# Patient Record
Sex: Female | Born: 1950 | Race: Black or African American | Hispanic: No | Marital: Single | Smoking: Former smoker
Health system: Southern US, Community
[De-identification: ages and names within clinical notes are randomized; demographics above are authoritative.]

## PROBLEM LIST (undated history)

## (undated) DIAGNOSIS — Z9889 Other specified postprocedural states: Secondary | ICD-10-CM

## (undated) DIAGNOSIS — M722 Plantar fascial fibromatosis: Secondary | ICD-10-CM

## (undated) DIAGNOSIS — E7800 Pure hypercholesterolemia, unspecified: Secondary | ICD-10-CM

## (undated) DIAGNOSIS — R112 Nausea with vomiting, unspecified: Secondary | ICD-10-CM

## (undated) DIAGNOSIS — I10 Essential (primary) hypertension: Secondary | ICD-10-CM

## (undated) DIAGNOSIS — I7300 Raynaud's syndrome without gangrene: Secondary | ICD-10-CM

## (undated) DIAGNOSIS — M797 Fibromyalgia: Secondary | ICD-10-CM

## (undated) DIAGNOSIS — M961 Postlaminectomy syndrome, not elsewhere classified: Secondary | ICD-10-CM

## (undated) DIAGNOSIS — Z8719 Personal history of other diseases of the digestive system: Secondary | ICD-10-CM

## (undated) DIAGNOSIS — M1990 Unspecified osteoarthritis, unspecified site: Secondary | ICD-10-CM

## (undated) DIAGNOSIS — R519 Headache, unspecified: Secondary | ICD-10-CM

## (undated) DIAGNOSIS — R609 Edema, unspecified: Secondary | ICD-10-CM

## (undated) DIAGNOSIS — G459 Transient cerebral ischemic attack, unspecified: Secondary | ICD-10-CM

## (undated) DIAGNOSIS — I639 Cerebral infarction, unspecified: Secondary | ICD-10-CM

## (undated) DIAGNOSIS — G5603 Carpal tunnel syndrome, bilateral upper limbs: Secondary | ICD-10-CM

## (undated) DIAGNOSIS — D649 Anemia, unspecified: Secondary | ICD-10-CM

## (undated) DIAGNOSIS — E039 Hypothyroidism, unspecified: Secondary | ICD-10-CM

## (undated) DIAGNOSIS — K219 Gastro-esophageal reflux disease without esophagitis: Secondary | ICD-10-CM

## (undated) HISTORY — DX: Gastro-esophageal reflux disease without esophagitis: K21.9

## (undated) HISTORY — DX: Fibromyalgia: M79.7

## (undated) HISTORY — DX: Transient cerebral ischemic attack, unspecified: G45.9

## (undated) HISTORY — DX: Essential (primary) hypertension: I10

## (undated) HISTORY — DX: Hypothyroidism, unspecified: E03.9

## (undated) HISTORY — DX: Pure hypercholesterolemia, unspecified: E78.00

## (undated) HISTORY — PX: BACK SURGERY: SHX140

## (undated) HISTORY — DX: Unspecified osteoarthritis, unspecified site: M19.90

## (undated) HISTORY — PX: ANTERIOR CERVICAL DECOMP/DISCECTOMY FUSION: SHX1161

## (undated) HISTORY — PX: POLYPECTOMY: SHX149

---

## 1972-11-02 HISTORY — PX: TUBAL LIGATION: SHX77

## 1973-11-02 HISTORY — PX: ABDOMINAL HYSTERECTOMY: SHX81

## 1978-11-02 HISTORY — PX: BREAST LUMPECTOMY: SHX2

## 1978-11-02 HISTORY — PX: BREAST EXCISIONAL BIOPSY: SUR124

## 1979-11-03 HISTORY — PX: BREAST CYST EXCISION: SHX579

## 1997-11-02 HISTORY — PX: LUMBAR DISC SURGERY: SHX700

## 2008-11-02 HISTORY — PX: BLADDER SUSPENSION: SHX72

## 2010-11-02 DIAGNOSIS — G459 Transient cerebral ischemic attack, unspecified: Secondary | ICD-10-CM

## 2010-11-02 HISTORY — DX: Transient cerebral ischemic attack, unspecified: G45.9

## 2012-08-04 ENCOUNTER — Emergency Department: Payer: Self-pay | Admitting: Emergency Medicine

## 2012-08-04 LAB — BASIC METABOLIC PANEL
Anion Gap: 7 (ref 7–16)
BUN: 10 mg/dL (ref 7–18)
Calcium, Total: 8.9 mg/dL (ref 8.5–10.1)
Chloride: 103 mmol/L (ref 98–107)
Co2: 31 mmol/L (ref 21–32)
Creatinine: 0.86 mg/dL (ref 0.60–1.30)
EGFR (Non-African Amer.): >60
Potassium: 4 mmol/L (ref 3.5–5.1)
Sodium: 141 mmol/L (ref 136–145)

## 2012-08-04 LAB — CBC
HCT: 35.8 % (ref 35.0–47.0)
MCV: 88 fL (ref 80–100)
RDW: 12.5 % (ref 11.5–14.5)
WBC: 4.3 10*3/uL (ref 3.6–11.0)

## 2012-11-02 HISTORY — PX: EXCISIONAL HEMORRHOIDECTOMY: SHX1541

## 2013-01-04 ENCOUNTER — Ambulatory Visit: Payer: Self-pay | Admitting: Gastroenterology

## 2013-02-27 ENCOUNTER — Ambulatory Visit: Payer: Self-pay | Admitting: Anesthesiology

## 2013-02-27 LAB — CBC
HCT: 35.9 % (ref 35.0–47.0)
MCH: 29.3 pg (ref 26.0–34.0)
MCHC: 34.5 g/dL (ref 32.0–36.0)
MCV: 85 fL (ref 80–100)
Platelet: 265 10*3/uL (ref 150–440)
RDW: 11.8 % (ref 11.5–14.5)
WBC: 3.3 10*3/uL — ABNORMAL LOW (ref 3.6–11.0)

## 2013-02-27 LAB — COMPREHENSIVE METABOLIC PANEL
Albumin: 3.9 g/dL (ref 3.4–5.0)
Alkaline Phosphatase: 147 U/L — ABNORMAL HIGH (ref 50–136)
Anion Gap: 9 (ref 7–16)
BUN: 7 mg/dL (ref 7–18)
Calcium, Total: 8.5 mg/dL (ref 8.5–10.1)
Chloride: 99 mmol/L (ref 98–107)
Co2: 27 mmol/L (ref 21–32)
Creatinine: 0.7 mg/dL (ref 0.60–1.30)
EGFR (Non-African Amer.): >60
Osmolality: 268 (ref 275–301)
Potassium: 3.9 mmol/L (ref 3.5–5.1)
SGOT(AST): 20 U/L (ref 15–37)
Sodium: 135 mmol/L — ABNORMAL LOW (ref 136–145)
Total Protein: 7.4 g/dL (ref 6.4–8.2)

## 2013-02-28 ENCOUNTER — Ambulatory Visit: Payer: Self-pay | Admitting: Surgery

## 2013-04-07 ENCOUNTER — Emergency Department: Payer: Self-pay | Admitting: Emergency Medicine

## 2013-04-07 LAB — CK TOTAL AND CKMB (NOT AT ARMC)
CK, Total: 146 U/L (ref 21–215)
CK-MB: 2.7 ng/mL (ref 0.5–3.6)

## 2013-04-07 LAB — URINALYSIS, COMPLETE
Bacteria: NONE SEEN
Bilirubin,UR: NEGATIVE
Blood: NEGATIVE
Glucose,UR: NEGATIVE mg/dL (ref 0–75)
Ketone: NEGATIVE
Leukocyte Esterase: NEGATIVE
Nitrite: NEGATIVE
RBC,UR: NONE SEEN /HPF (ref 0–5)
WBC UR: <1 /HPF (ref 0–5)

## 2013-04-07 LAB — COMPREHENSIVE METABOLIC PANEL
Albumin: 4.1 g/dL (ref 3.4–5.0)
Calcium, Total: 9.1 mg/dL (ref 8.5–10.1)
Co2: 27 mmol/L (ref 21–32)
Creatinine: 0.63 mg/dL (ref 0.60–1.30)
Glucose: 91 mg/dL (ref 65–99)
Osmolality: 274 (ref 275–301)
SGPT (ALT): 21 U/L (ref 12–78)

## 2013-04-07 LAB — CBC
HCT: 40.2 % (ref 35.0–47.0)
HGB: 13.7 g/dL (ref 12.0–16.0)
MCH: 28.6 pg (ref 26.0–34.0)
MCHC: 34.1 g/dL (ref 32.0–36.0)
MCV: 84 fL (ref 80–100)
Platelet: 317 10*3/uL (ref 150–440)
RDW: 12.4 % (ref 11.5–14.5)

## 2014-03-29 ENCOUNTER — Ambulatory Visit: Payer: Self-pay | Admitting: Family Medicine

## 2014-11-09 ENCOUNTER — Ambulatory Visit: Payer: Self-pay | Admitting: Family Medicine

## 2015-01-09 ENCOUNTER — Emergency Department: Payer: Self-pay | Admitting: Emergency Medicine

## 2015-02-22 NOTE — Op Note (Signed)
PATIENT NAME:  Tommi EmeryBROADNAX, Jamilyn MR#:  [id number] DATE OF BIRTH:  1951/08/27  DATE OF PROCEDURE:  02/28/2013  PREOPERATIVE DIAGNOSIS: Internal and external hemorrhoids.   POSTOPERATIVE DIAGNOSIS: Internal and external hemorrhoids.   PROCEDURE: Internal and external hemorrhoidectomy.   SURGEON: Renda RollsWilton Chondra Boyde, M.D.   ANESTHESIA: General.   INDICATIONS: This 64 year old female has a history of anal pain, hemorrhoid prolapse, and bleeding, and large internal and external hemorrhoids were demonstrated on physical exam and surgery was recommended for definitive treatment.   DESCRIPTION OF PROCEDURE: The patient was placed on the operating table in the supine position under general anesthesia. Legs were elevated into the lithotomy position using ankle straps. The anoderm was examined. There were 4 external hemorrhoids identified which appeared to be in 4 quadrants. The anoderm was infiltrated with 0.5% Sensorcaine with epinephrine. The anal canal was dilated large enough to admit  3 fingers. The bivalve anal retractor was introduced. Multiple large internal hemorrhoids were identified which appeared to be in continuity with the external hemorrhoids. At the 8 o'clock position a high ligation of the internal component was done with a 2-0 chromic suture ligature. A V-shaped incision was made externally, and the external hemorrhoids were dissected away from surrounding subcutaneous tissues with a combination of sharp dissection and electrocautery. The internal anal sphincter was identified. The dissection was carried up to the previously placed suture-ligature and the hemorrhoid was further ligated and amputated. The wound was inspected. Several small bleeding points were cauterized. The wound was then closed with running, locked, tied 2-0 chromic suture, leaving a small opening externally for drainage.   This same procedure was carried out at the 4 o'clock position.   The same procedure was carried out at the  10 o'clock position.   At the 2 o'clock position the internal component was ligated with a 2-0 chromic ligature, and the external hemorrhoid was excised sharply with scissors and wound repaired with 2-0 chromic simple sutures.   No neoplasm was seen during the procedure and hemostasis appeared to be intact. The skin and subcutaneous tissues, and tissues around the sphincter were infiltrated with 20 mL of Exparel.  Dressings were applied with paper tape. The patient tolerated surgery satisfactorily and was prepared for transfer to the recovery room.     ____________________________ Shela CommonsJ. Renda RollsWilton Francesa Eugenio, MD jws:dm D: 02/28/2013 09:26:53 ET T: 02/28/2013 10:22:41 ET JOB#: [id number]  cc: Adella HareJ. Wilton Yatziry Deakins, MD, <Dictator> Adella HareWILTON J Amron Guerrette MD ELECTRONICALLY SIGNED 03/03/2013 20:12

## 2015-03-05 ENCOUNTER — Ambulatory Visit: Payer: Self-pay | Admitting: Pain Medicine

## 2015-03-14 ENCOUNTER — Ambulatory Visit: Payer: Medicare Other | Attending: Pain Medicine | Admitting: Pain Medicine

## 2015-03-14 ENCOUNTER — Encounter (INDEPENDENT_AMBULATORY_CARE_PROVIDER_SITE_OTHER): Payer: Self-pay

## 2015-03-14 ENCOUNTER — Encounter: Payer: Self-pay | Admitting: Pain Medicine

## 2015-03-14 VITALS — BP 127/63 | HR 71 | Temp 98.3°F | Resp 16 | Ht 65.0 in | Wt 170.0 lb

## 2015-03-14 DIAGNOSIS — M79605 Pain in left leg: Secondary | ICD-10-CM | POA: Diagnosis not present

## 2015-03-14 DIAGNOSIS — M79604 Pain in right leg: Secondary | ICD-10-CM | POA: Insufficient documentation

## 2015-03-14 DIAGNOSIS — M5030 Other cervical disc degeneration, unspecified cervical region: Secondary | ICD-10-CM

## 2015-03-14 DIAGNOSIS — M545 Low back pain: Secondary | ICD-10-CM | POA: Insufficient documentation

## 2015-03-14 DIAGNOSIS — G43809 Other migraine, not intractable, without status migrainosus: Secondary | ICD-10-CM

## 2015-03-14 DIAGNOSIS — M47816 Spondylosis without myelopathy or radiculopathy, lumbar region: Secondary | ICD-10-CM

## 2015-03-14 DIAGNOSIS — M542 Cervicalgia: Secondary | ICD-10-CM | POA: Diagnosis not present

## 2015-03-14 DIAGNOSIS — M5136 Other intervertebral disc degeneration, lumbar region: Secondary | ICD-10-CM | POA: Insufficient documentation

## 2015-03-14 DIAGNOSIS — G43909 Migraine, unspecified, not intractable, without status migrainosus: Secondary | ICD-10-CM | POA: Insufficient documentation

## 2015-03-14 DIAGNOSIS — M47812 Spondylosis without myelopathy or radiculopathy, cervical region: Secondary | ICD-10-CM | POA: Insufficient documentation

## 2015-03-14 DIAGNOSIS — M5481 Occipital neuralgia: Secondary | ICD-10-CM

## 2015-03-14 NOTE — Progress Notes (Signed)
Patient discharged ambulatory. teachback 3 done.

## 2015-03-14 NOTE — Progress Notes (Signed)
   Subjective:    Patient ID: Kristin Stuart, female    DOB: May 08, 1951, 64 y.o.   MRN: [id number]  HPI    Review of Systems     Objective:   Physical Exam        Assessment & Plan:

## 2015-03-14 NOTE — Patient Instructions (Addendum)
Continue present medications as per Dr Azzie GlatterFras  F/U PCP for evaliation of  BP and general medical  condition.  F/U surgical evaluation. Nurses please schedule neurosurgical evaluation of pain of the neck and upper extremity pain and weakness and low back pain with lower extremity pain and weakness ASAP   F/U neuological evaluation has been discussed and we will avoid PNCV/ EMG study at this time  May consider radiofrequency rhizolysis or intraspinal procedures pending response to present treatment and F/U evaluation.  [facility] to call [facility] should patient have concerns prior to scheduled return appointment.
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
## 2015-03-27 ENCOUNTER — Other Ambulatory Visit: Payer: Self-pay | Admitting: Pain Medicine

## 2015-05-14 ENCOUNTER — Telehealth: Payer: Self-pay | Admitting: Pain Medicine

## 2015-05-14 NOTE — Telephone Encounter (Signed)
vmail left by patient 05-13-15 5:17 asking about appts that were supposed to be made and did we get information from [facility]? Does Dr. Metta Clinesrisp still require her to have x-rays done?

## 2015-05-17 ENCOUNTER — Telehealth: Payer: Self-pay | Admitting: Pain Medicine

## 2015-05-17 NOTE — Telephone Encounter (Deleted)
PHYS  Wants her to have emg studies in her back / she is still waiting for this appt. States we had to [place]. She has called several times.  She is out of her pain medicines on the 20th

## 2015-05-17 NOTE — Telephone Encounter (Signed)
cancel

## 2015-06-24 ENCOUNTER — Encounter: Payer: Self-pay | Admitting: *Deleted

## 2015-06-24 DIAGNOSIS — E780 Pure hypercholesterolemia: Secondary | ICD-10-CM | POA: Diagnosis not present

## 2015-06-24 DIAGNOSIS — E079 Disorder of thyroid, unspecified: Secondary | ICD-10-CM | POA: Diagnosis not present

## 2015-06-24 DIAGNOSIS — Z90710 Acquired absence of both cervix and uterus: Secondary | ICD-10-CM | POA: Diagnosis not present

## 2015-06-24 DIAGNOSIS — Z9889 Other specified postprocedural states: Secondary | ICD-10-CM | POA: Diagnosis not present

## 2015-06-24 DIAGNOSIS — H2511 Age-related nuclear cataract, right eye: Secondary | ICD-10-CM | POA: Diagnosis not present

## 2015-06-24 DIAGNOSIS — M7989 Other specified soft tissue disorders: Secondary | ICD-10-CM | POA: Diagnosis not present

## 2015-06-24 DIAGNOSIS — Z7982 Long term (current) use of aspirin: Secondary | ICD-10-CM | POA: Diagnosis not present

## 2015-06-24 DIAGNOSIS — I10 Essential (primary) hypertension: Secondary | ICD-10-CM | POA: Diagnosis not present

## 2015-06-24 DIAGNOSIS — M961 Postlaminectomy syndrome, not elsewhere classified: Secondary | ICD-10-CM | POA: Diagnosis not present

## 2015-06-24 DIAGNOSIS — K449 Diaphragmatic hernia without obstruction or gangrene: Secondary | ICD-10-CM | POA: Diagnosis not present

## 2015-06-24 DIAGNOSIS — Z87891 Personal history of nicotine dependence: Secondary | ICD-10-CM | POA: Diagnosis not present

## 2015-06-24 DIAGNOSIS — Z79899 Other long term (current) drug therapy: Secondary | ICD-10-CM | POA: Diagnosis not present

## 2015-06-24 DIAGNOSIS — K219 Gastro-esophageal reflux disease without esophagitis: Secondary | ICD-10-CM | POA: Diagnosis not present

## 2015-06-24 DIAGNOSIS — Z7951 Long term (current) use of inhaled steroids: Secondary | ICD-10-CM | POA: Diagnosis not present

## 2015-06-24 DIAGNOSIS — Z880 Allergy status to penicillin: Secondary | ICD-10-CM | POA: Diagnosis not present

## 2015-06-24 DIAGNOSIS — M797 Fibromyalgia: Secondary | ICD-10-CM | POA: Diagnosis not present

## 2015-06-24 DIAGNOSIS — Z79891 Long term (current) use of opiate analgesic: Secondary | ICD-10-CM | POA: Diagnosis not present

## 2015-06-24 DIAGNOSIS — Z888 Allergy status to other drugs, medicaments and biological substances status: Secondary | ICD-10-CM | POA: Diagnosis not present

## 2015-06-24 DIAGNOSIS — M1990 Unspecified osteoarthritis, unspecified site: Secondary | ICD-10-CM | POA: Diagnosis not present

## 2015-06-24 DIAGNOSIS — Z8673 Personal history of transient ischemic attack (TIA), and cerebral infarction without residual deficits: Secondary | ICD-10-CM | POA: Diagnosis not present

## 2015-06-25 ENCOUNTER — Ambulatory Visit
Admission: RE | Admit: 2015-06-25 | Discharge: 2015-06-25 | Disposition: A | Discharge status: Home / Self Care | Payer: Medicare Other | Source: Ambulatory Visit | Attending: Ophthalmology | Admitting: Ophthalmology

## 2015-06-25 ENCOUNTER — Encounter: Payer: Self-pay | Admitting: *Deleted

## 2015-06-25 ENCOUNTER — Encounter
Admission: RE | Disposition: A | Discharge status: Home / Self Care | Payer: Self-pay | Source: Ambulatory Visit | Attending: Ophthalmology

## 2015-06-25 ENCOUNTER — Ambulatory Visit: Payer: Medicare Other | Admitting: Anesthesiology

## 2015-06-25 DIAGNOSIS — Z8673 Personal history of transient ischemic attack (TIA), and cerebral infarction without residual deficits: Secondary | ICD-10-CM | POA: Insufficient documentation

## 2015-06-25 DIAGNOSIS — Z90710 Acquired absence of both cervix and uterus: Secondary | ICD-10-CM | POA: Insufficient documentation

## 2015-06-25 DIAGNOSIS — Z888 Allergy status to other drugs, medicaments and biological substances status: Secondary | ICD-10-CM | POA: Insufficient documentation

## 2015-06-25 DIAGNOSIS — K219 Gastro-esophageal reflux disease without esophagitis: Secondary | ICD-10-CM | POA: Insufficient documentation

## 2015-06-25 DIAGNOSIS — M797 Fibromyalgia: Secondary | ICD-10-CM | POA: Insufficient documentation

## 2015-06-25 DIAGNOSIS — Z9889 Other specified postprocedural states: Secondary | ICD-10-CM | POA: Insufficient documentation

## 2015-06-25 DIAGNOSIS — M961 Postlaminectomy syndrome, not elsewhere classified: Secondary | ICD-10-CM | POA: Insufficient documentation

## 2015-06-25 DIAGNOSIS — I10 Essential (primary) hypertension: Secondary | ICD-10-CM | POA: Insufficient documentation

## 2015-06-25 DIAGNOSIS — Z7951 Long term (current) use of inhaled steroids: Secondary | ICD-10-CM | POA: Insufficient documentation

## 2015-06-25 DIAGNOSIS — E079 Disorder of thyroid, unspecified: Secondary | ICD-10-CM | POA: Insufficient documentation

## 2015-06-25 DIAGNOSIS — Z79891 Long term (current) use of opiate analgesic: Secondary | ICD-10-CM | POA: Insufficient documentation

## 2015-06-25 DIAGNOSIS — H2511 Age-related nuclear cataract, right eye: Secondary | ICD-10-CM | POA: Insufficient documentation

## 2015-06-25 DIAGNOSIS — Z880 Allergy status to penicillin: Secondary | ICD-10-CM | POA: Insufficient documentation

## 2015-06-25 DIAGNOSIS — M1990 Unspecified osteoarthritis, unspecified site: Secondary | ICD-10-CM | POA: Insufficient documentation

## 2015-06-25 DIAGNOSIS — Z79899 Other long term (current) drug therapy: Secondary | ICD-10-CM | POA: Insufficient documentation

## 2015-06-25 DIAGNOSIS — E780 Pure hypercholesterolemia: Secondary | ICD-10-CM | POA: Insufficient documentation

## 2015-06-25 DIAGNOSIS — K449 Diaphragmatic hernia without obstruction or gangrene: Secondary | ICD-10-CM | POA: Insufficient documentation

## 2015-06-25 DIAGNOSIS — Z87891 Personal history of nicotine dependence: Secondary | ICD-10-CM | POA: Insufficient documentation

## 2015-06-25 DIAGNOSIS — M7989 Other specified soft tissue disorders: Secondary | ICD-10-CM | POA: Insufficient documentation

## 2015-06-25 DIAGNOSIS — Z7982 Long term (current) use of aspirin: Secondary | ICD-10-CM | POA: Insufficient documentation

## 2015-06-25 HISTORY — DX: Edema, unspecified: R60.9

## 2015-06-25 HISTORY — PX: CATARACT EXTRACTION W/PHACO: SHX586

## 2015-06-25 HISTORY — DX: Personal history of other diseases of the digestive system: Z87.19

## 2015-06-25 HISTORY — DX: Other specified postprocedural states: Z98.890

## 2015-06-25 HISTORY — DX: Other specified postprocedural states: R11.2

## 2015-06-25 SURGERY — PHACOEMULSIFICATION, CATARACT, WITH IOL INSERTION
Anesthesia: Monitor Anesthesia Care | Site: Eye | Laterality: Right | Wound class: Clean

## 2015-06-25 MED ORDER — ARMC OPHTHALMIC DILATING GEL
1.0000 "application " | OPHTHALMIC | Status: DC | PRN
  Administered 2015-06-25: 1 via OPHTHALMIC

## 2015-06-25 MED ORDER — MOXIFLOXACIN HCL 0.5 % OP SOLN
OPHTHALMIC | Status: DC | PRN
  Administered 2015-06-25: 1 [drp] via OPHTHALMIC

## 2015-06-25 MED ORDER — FENTANYL CITRATE (PF) 100 MCG/2ML IJ SOLN
INTRAMUSCULAR | Status: DC | PRN
  Administered 2015-06-25 (×2): 50 ug via INTRAVENOUS

## 2015-06-25 MED ORDER — SODIUM CHLORIDE 0.9 % IV SOLN
INTRAVENOUS | Status: DC
  Administered 2015-06-25 (×2): via INTRAVENOUS

## 2015-06-25 MED ORDER — NA CHONDROIT SULF-NA HYALURON 40-17 MG/ML IO SOLN
INTRAOCULAR | Status: DC | PRN
  Administered 2015-06-25: 1 mL via INTRAOCULAR

## 2015-06-25 MED ORDER — POVIDONE-IODINE 5 % OP SOLN
OPHTHALMIC | Status: AC
  Administered 2015-06-25: 1 via OPHTHALMIC
  Filled 2015-06-25: qty 30

## 2015-06-25 MED ORDER — CEFUROXIME OPHTHALMIC INJECTION 1 MG/0.1 ML
INJECTION | OPHTHALMIC | Status: DC | PRN
  Administered 2015-06-25: 0.1 mL via INTRACAMERAL

## 2015-06-25 MED ORDER — POVIDONE-IODINE 5 % OP SOLN
1.0000 "application " | OPHTHALMIC | Status: AC | PRN
  Administered 2015-06-25: 1 via OPHTHALMIC

## 2015-06-25 MED ORDER — MOXIFLOXACIN HCL 0.5 % OP SOLN
OPHTHALMIC | Status: AC
  Filled 2015-06-25: qty 3

## 2015-06-25 MED ORDER — TETRACAINE HCL 0.5 % OP SOLN
OPHTHALMIC | Status: AC
  Administered 2015-06-25: 1 [drp] via OPHTHALMIC
  Filled 2015-06-25: qty 2

## 2015-06-25 MED ORDER — EPINEPHRINE HCL 1 MG/ML IJ SOLN
INTRAOCULAR | Status: DC | PRN
  Administered 2015-06-25: 10:00:00 via OPHTHALMIC

## 2015-06-25 MED ORDER — MOXIFLOXACIN HCL 0.5 % OP SOLN: 1.0000 [drp] | OPHTHALMIC | Status: DC | PRN

## 2015-06-25 MED ORDER — ARMC OPHTHALMIC DILATING GEL
OPHTHALMIC | Status: AC
  Administered 2015-06-25: 1 via OPHTHALMIC
  Filled 2015-06-25: qty 0.25

## 2015-06-25 MED ORDER — MIDAZOLAM HCL 2 MG/2ML IJ SOLN
INTRAMUSCULAR | Status: DC | PRN
  Administered 2015-06-25 (×2): 1 mg via INTRAVENOUS

## 2015-06-25 MED ORDER — TETRACAINE HCL 0.5 % OP SOLN
1.0000 [drp] | OPHTHALMIC | Status: AC | PRN
  Administered 2015-06-25: 1 [drp] via OPHTHALMIC

## 2015-06-25 SURGICAL SUPPLY — 22 items
CANNULA ANT/CHMB 27GA (MISCELLANEOUS) ×3 IMPLANT
CUP MEDICINE 2OZ PLAST GRAD ST (MISCELLANEOUS) ×3 IMPLANT
GLOVE BIO SURGEON STRL SZ8 (GLOVE) ×3 IMPLANT
GLOVE BIOGEL M 6.5 STRL (GLOVE) ×3 IMPLANT
GLOVE SURG LX 8.0 MICRO (GLOVE) ×2
GLOVE SURG LX STRL 8.0 MICRO (GLOVE) ×1 IMPLANT
GOWN STRL REUS W/ TWL LRG LVL3 (GOWN DISPOSABLE) ×2 IMPLANT
GOWN STRL REUS W/TWL LRG LVL3 (GOWN DISPOSABLE) ×4
LENS IOL TECNIS 23.0 (Intraocular Lens) ×3 IMPLANT
LENS IOL TECNIS MONO 1P 23.0 (Intraocular Lens) ×1 IMPLANT
PACK CATARACT (MISCELLANEOUS) ×3 IMPLANT
PACK CATARACT BRASINGTON LX (MISCELLANEOUS) ×3 IMPLANT
PACK EYE AFTER SURG (MISCELLANEOUS) ×3 IMPLANT
SOL BSS BAG (MISCELLANEOUS) ×3
SOL PREP PVP 2OZ (MISCELLANEOUS) ×3
SOLUTION BSS BAG (MISCELLANEOUS) ×1 IMPLANT
SOLUTION PREP PVP 2OZ (MISCELLANEOUS) ×1 IMPLANT
SYR 3ML LL SCALE MARK (SYRINGE) ×3 IMPLANT
SYR 5ML LL (SYRINGE) ×3 IMPLANT
SYR TB 1ML 27GX1/2 LL (SYRINGE) ×3 IMPLANT
WATER STERILE IRR 1000ML POUR (IV SOLUTION) ×3 IMPLANT
WIPE NON LINTING 3.25X3.25 (MISCELLANEOUS) ×3 IMPLANT

## 2015-06-25 NOTE — Transfer of Care (Signed)
Immediate Anesthesia Transfer of Care Note  Patient: Kristin Stuart  Procedure(s) Performed: Procedure(s) with comments: CATARACT EXTRACTION PHACO AND INTRAOCULAR LENS PLACEMENT (IOC) (Right) - US:00:54.9 AP:25.9 CDE:14.22 LOT PAK #[id number]  Patient Location: PACU  Anesthesia Type:MAC  Level of Consciousness: awake  Airway & Oxygen Therapy: Patient Spontanous Breathing and Patient connected to nasal cannula oxygen  Post-op Assessment: Report given to RN and Post -op Vital signs reviewed and stable  Post vital signs: Reviewed and stable  Last Vitals:  Filed Vitals:   06/25/15 1038  BP: 170/97  Pulse: 60  Temp: 36.4 C  Resp: 16    Complications: No apparent anesthesia complications

## 2015-06-25 NOTE — Anesthesia Procedure Notes (Signed)
Procedure Name: MAC Date/Time: 06/25/2015 10:24 AM Performed by: Junious Silk Pre-anesthesia Checklist: Patient identified, Emergency Drugs available, Suction available, Patient being monitored and Timeout performed Oxygen Delivery Method: Nasal cannula

## 2015-06-25 NOTE — Anesthesia Postprocedure Evaluation (Signed)
  Anesthesia Post-op Note  Patient: Kristin Stuart  Procedure(s) Performed: Procedure(s) with comments: CATARACT EXTRACTION PHACO AND INTRAOCULAR LENS PLACEMENT (IOC) (Right) - US:00:54.9 AP:25.9 CDE:14.22 LOT PAK #[id number]  Anesthesia type:MAC  Patient location: PACU  Post pain: Pain level controlled  Post assessment: Post-op Vital signs reviewed, Patient's Cardiovascular Status Stable, Respiratory Function Stable, Patent Airway and No signs of Nausea or vomiting  Post vital signs: Reviewed and stable  Last Vitals:  Filed Vitals:   06/25/15 1046  BP: 140/74  Pulse: 65  Temp: 36.4 C  Resp: 16    Level of consciousness: awake, alert  and patient cooperative  Complications: No apparent anesthesia complications

## 2015-06-25 NOTE — Op Note (Signed)
PREOPERATIVE DIAGNOSIS:  Nuclear sclerotic cataract of the right eye.   POSTOPERATIVE DIAGNOSIS: CATARACT   OPERATIVE PROCEDURE:  Procedure(s): CATARACT EXTRACTION PHACO AND INTRAOCULAR LENS PLACEMENT (IOC)   SURGEON:  Galen Manila, MD.   ANESTHESIA:  Anesthesiologist: Yves Dill, MD CRNA: Junious Silk, CRNA  1.      Managed anesthesia care. 2.      Topical tetracaine drops followed by 2% Xylocaine jelly applied in the preoperative holding area.   COMPLICATIONS:  None.   TECHNIQUE:   Stop and chop   DESCRIPTION OF PROCEDURE:  The patient was examined and consented in the preoperative holding area where the aforementioned topical anesthesia was applied to the right eye and then brought back to the Operating Room where the right eye was prepped and draped in the usual sterile ophthalmic fashion and a lid speculum was placed. A paracentesis was created with the side port blade and the anterior chamber was filled with viscoelastic. A near clear corneal incision was performed with the steel keratome. A continuous curvilinear capsulorrhexis was performed with a cystotome followed by the capsulorrhexis forceps. Hydrodissection and hydrodelineation were carried out with BSS on a blunt cannula. The lens was removed in a stop and chop  technique and the remaining cortical material was removed with the irrigation-aspiration handpiece. The capsular bag was inflated with viscoelastic and the Technis ZCB00  lens was placed in the capsular bag without complication. The remaining viscoelastic was removed from the eye with the irrigation-aspiration handpiece. The wounds were hydrated. The anterior chamber was flushed with Miostat and the eye was inflated to physiologic pressure. 0.1 mL of cefuroxime concentration 10 mg/mL was placed in the anterior chamber. The wounds were found to be water tight. The eye was dressed with Vigamox. The patient was given protective glasses to wear throughout the day and a  shield with which to sleep tonight. The patient was also given drops with which to begin a drop regimen today and will follow-up with me in one day.  Implant Name Type Inv. Item Serial No. Manufacturer Lot No. LRB No. Used  LENS IMPL INTRAOC ZCB00 23.0 - [id number] Intraocular Lens LENS IMPL INTRAOC ZCB00 23.0 [id number] AMO   Right 1   Procedure(s) with comments: CATARACT EXTRACTION PHACO AND INTRAOCULAR LENS PLACEMENT (IOC) (Right) - US:00:54.9 AP:25.9 CDE:14.22 LOT PAK #[id number]  Electronically signed: Latashia Koch LOUIS 06/25/2015 10:41 AM

## 2015-06-25 NOTE — H&P (Signed)
  All labs reviewed. Abnormal studies sent to patients PCP when indicated.  Previous H&P reviewed, patient examined, there are NO CHANGES.  Kristin Gleed LOUIS8/23/201610:11 AM

## 2015-06-25 NOTE — Addendum Note (Signed)
Addendum  created 06/25/15 1056 by Junious Silk, CRNA   Modules edited: Anesthesia Flowsheet, Anesthesia Medication Administration

## 2015-06-25 NOTE — Anesthesia Preprocedure Evaluation (Signed)
Anesthesia Evaluation  Patient identified by MRN, date of birth, ID band  Reviewed: Allergy & Precautions, NPO status , Patient's Chart, lab work & pertinent test results, reviewed documented beta blocker date and time   History of Anesthesia Complications (+) PONV and history of anesthetic complications  Airway Mallampati: II  TM Distance: <3 FB Neck ROM: Full    Dental no notable dental hx.    Pulmonary former smoker,    Pulmonary exam normal       Cardiovascular hypertension, Pt. on medications and Pt. on home beta blockers Normal cardiovascular exam    Neuro/Psych  Headaches, TIAnegative psych ROS   GI/Hepatic hiatal hernia, GERD-  Medicated and Controlled,  Endo/Other  Hypothyroidism   Renal/GU      Musculoskeletal  (+) Arthritis -, Osteoarthritis,  Fibromyalgia -  Abdominal Normal abdominal exam  (+)   Peds  Hematology   Anesthesia Other Findings   Reproductive/Obstetrics                             Anesthesia Physical Anesthesia Plan  ASA: III  Anesthesia Plan: MAC   Post-op Pain Management:    Induction:   Airway Management Planned: Nasal Cannula  Additional Equipment:   Intra-op Plan:   Post-operative Plan:   Informed Consent: I have reviewed the patients History and Physical, chart, labs and discussed the procedure including the risks, benefits and alternatives for the proposed anesthesia with the patient or authorized representative who has indicated his/her understanding and acceptance.   Dental advisory given  Plan Discussed with: CRNA and Surgeon  Anesthesia Plan Comments:         Anesthesia Quick Evaluation

## 2015-07-11 ENCOUNTER — Encounter: Payer: Self-pay | Admitting: *Deleted

## 2015-07-16 ENCOUNTER — Encounter
Admission: RE | Disposition: A | Discharge status: Home / Self Care | Payer: Self-pay | Source: Ambulatory Visit | Attending: Ophthalmology

## 2015-07-16 ENCOUNTER — Ambulatory Visit: Payer: Medicare Other | Admitting: Anesthesiology

## 2015-07-16 ENCOUNTER — Ambulatory Visit
Admission: RE | Admit: 2015-07-16 | Discharge: 2015-07-16 | Disposition: A | Discharge status: Home / Self Care | Payer: Medicare Other | Source: Ambulatory Visit | Attending: Ophthalmology | Admitting: Ophthalmology

## 2015-07-16 ENCOUNTER — Encounter: Payer: Self-pay | Admitting: *Deleted

## 2015-07-16 DIAGNOSIS — K219 Gastro-esophageal reflux disease without esophagitis: Secondary | ICD-10-CM | POA: Insufficient documentation

## 2015-07-16 DIAGNOSIS — M1990 Unspecified osteoarthritis, unspecified site: Secondary | ICD-10-CM | POA: Diagnosis not present

## 2015-07-16 DIAGNOSIS — I10 Essential (primary) hypertension: Secondary | ICD-10-CM | POA: Insufficient documentation

## 2015-07-16 DIAGNOSIS — H2512 Age-related nuclear cataract, left eye: Secondary | ICD-10-CM | POA: Insufficient documentation

## 2015-07-16 DIAGNOSIS — R600 Localized edema: Secondary | ICD-10-CM | POA: Diagnosis not present

## 2015-07-16 DIAGNOSIS — M797 Fibromyalgia: Secondary | ICD-10-CM | POA: Diagnosis not present

## 2015-07-16 DIAGNOSIS — Z87891 Personal history of nicotine dependence: Secondary | ICD-10-CM | POA: Insufficient documentation

## 2015-07-16 DIAGNOSIS — D649 Anemia, unspecified: Secondary | ICD-10-CM | POA: Diagnosis not present

## 2015-07-16 DIAGNOSIS — Z888 Allergy status to other drugs, medicaments and biological substances status: Secondary | ICD-10-CM | POA: Diagnosis not present

## 2015-07-16 DIAGNOSIS — Z8673 Personal history of transient ischemic attack (TIA), and cerebral infarction without residual deficits: Secondary | ICD-10-CM | POA: Diagnosis not present

## 2015-07-16 DIAGNOSIS — E780 Pure hypercholesterolemia: Secondary | ICD-10-CM | POA: Diagnosis not present

## 2015-07-16 DIAGNOSIS — Z880 Allergy status to penicillin: Secondary | ICD-10-CM | POA: Insufficient documentation

## 2015-07-16 DIAGNOSIS — E039 Hypothyroidism, unspecified: Secondary | ICD-10-CM | POA: Insufficient documentation

## 2015-07-16 DIAGNOSIS — K449 Diaphragmatic hernia without obstruction or gangrene: Secondary | ICD-10-CM | POA: Insufficient documentation

## 2015-07-16 DIAGNOSIS — M961 Postlaminectomy syndrome, not elsewhere classified: Secondary | ICD-10-CM | POA: Diagnosis not present

## 2015-07-16 HISTORY — DX: Anemia, unspecified: D64.9

## 2015-07-16 HISTORY — PX: CATARACT EXTRACTION W/PHACO: SHX586

## 2015-07-16 HISTORY — DX: Cerebral infarction, unspecified: I63.9

## 2015-07-16 HISTORY — DX: Postlaminectomy syndrome, not elsewhere classified: M96.1

## 2015-07-16 SURGERY — PHACOEMULSIFICATION, CATARACT, WITH IOL INSERTION
Anesthesia: Monitor Anesthesia Care | Site: Eye | Laterality: Left | Wound class: Clean

## 2015-07-16 MED ORDER — BSS IO SOLN
INTRAOCULAR | Status: DC | PRN
  Administered 2015-07-16: 200 mL via OPHTHALMIC

## 2015-07-16 MED ORDER — NA CHONDROIT SULF-NA HYALURON 40-17 MG/ML IO SOLN
INTRAOCULAR | Status: AC
  Filled 2015-07-16: qty 1

## 2015-07-16 MED ORDER — NA CHONDROIT SULF-NA HYALURON 40-17 MG/ML IO SOLN
INTRAOCULAR | Status: DC | PRN
  Administered 2015-07-16: 1 mL via INTRAOCULAR

## 2015-07-16 MED ORDER — ARMC OPHTHALMIC DILATING GEL
1.0000 "application " | OPHTHALMIC | Status: AC | PRN
  Administered 2015-07-16 (×2): 1 via OPHTHALMIC

## 2015-07-16 MED ORDER — CEFUROXIME OPHTHALMIC INJECTION 1 MG/0.1 ML
INJECTION | OPHTHALMIC | Status: AC
Start: 2015-07-16 — End: 2015-07-16
  Filled 2015-07-16: qty 0.1

## 2015-07-16 MED ORDER — ARMC OPHTHALMIC DILATING GEL
OPHTHALMIC | Status: AC
  Administered 2015-07-16: 1 via OPHTHALMIC
  Filled 2015-07-16: qty 0.25

## 2015-07-16 MED ORDER — SODIUM CHLORIDE 0.9 % IV SOLN
INTRAVENOUS | Status: DC
Start: 2015-07-16 — End: 2015-07-16
  Administered 2015-07-16: 10:00:00 via INTRAVENOUS

## 2015-07-16 MED ORDER — EPINEPHRINE HCL 1 MG/ML IJ SOLN
INTRAMUSCULAR | Status: AC
  Filled 2015-07-16: qty 1

## 2015-07-16 MED ORDER — TETRACAINE HCL 0.5 % OP SOLN
1.0000 [drp] | OPHTHALMIC | Status: AC | PRN
  Administered 2015-07-16: 1 [drp] via OPHTHALMIC

## 2015-07-16 MED ORDER — MIDAZOLAM HCL 2 MG/2ML IJ SOLN
INTRAMUSCULAR | Status: DC | PRN
  Administered 2015-07-16: 1 mg via INTRAVENOUS

## 2015-07-16 MED ORDER — ACETAMINOPHEN 325 MG PO TABS
ORAL_TABLET | ORAL | Status: AC
  Administered 2015-07-16: 12:00:00
  Filled 2015-07-16: qty 2

## 2015-07-16 MED ORDER — TETRACAINE HCL 0.5 % OP SOLN
OPHTHALMIC | Status: AC
  Administered 2015-07-16: 1 [drp] via OPHTHALMIC
  Filled 2015-07-16: qty 2

## 2015-07-16 MED ORDER — POVIDONE-IODINE 5 % OP SOLN
1.0000 "application " | OPHTHALMIC | Status: AC | PRN
  Administered 2015-07-16: 1 via OPHTHALMIC

## 2015-07-16 MED ORDER — POVIDONE-IODINE 5 % OP SOLN
OPHTHALMIC | Status: AC
  Administered 2015-07-16: 1 via OPHTHALMIC
  Filled 2015-07-16: qty 30

## 2015-07-16 MED ORDER — FENTANYL CITRATE (PF) 100 MCG/2ML IJ SOLN
INTRAMUSCULAR | Status: DC | PRN
  Administered 2015-07-16: 25 ug via INTRAVENOUS

## 2015-07-16 MED ORDER — MOXIFLOXACIN HCL 0.5 % OP SOLN
OPHTHALMIC | Status: DC | PRN
Start: 2015-07-16 — End: 2015-07-16
  Administered 2015-07-16: 9 [drp] via OPHTHALMIC

## 2015-07-16 MED ORDER — MOXIFLOXACIN HCL 0.5 % OP SOLN: 1.0000 [drp] | OPHTHALMIC | Status: DC | PRN

## 2015-07-16 MED ORDER — MOXIFLOXACIN HCL 0.5 % OP SOLN
OPHTHALMIC | Status: AC
  Filled 2015-07-16: qty 3

## 2015-07-16 SURGICAL SUPPLY — 22 items
CANNULA ANT/CHMB 27GA (MISCELLANEOUS) ×3 IMPLANT
CUP MEDICINE 2OZ PLAST GRAD ST (MISCELLANEOUS) ×3 IMPLANT
GLOVE BIO SURGEON STRL SZ8 (GLOVE) ×3 IMPLANT
GLOVE BIOGEL M 6.5 STRL (GLOVE) ×3 IMPLANT
GLOVE SURG LX 8.0 MICRO (GLOVE) ×2
GLOVE SURG LX STRL 8.0 MICRO (GLOVE) ×1 IMPLANT
GOWN STRL REUS W/ TWL LRG LVL3 (GOWN DISPOSABLE) ×2 IMPLANT
GOWN STRL REUS W/TWL LRG LVL3 (GOWN DISPOSABLE) ×4
LENS IOL TECNIS 23.0 (Intraocular Lens) ×3 IMPLANT
LENS IOL TECNIS MONO 1P 23.0 (Intraocular Lens) ×1 IMPLANT
PACK CATARACT (MISCELLANEOUS) ×3 IMPLANT
PACK CATARACT BRASINGTON LX (MISCELLANEOUS) ×3 IMPLANT
PACK EYE AFTER SURG (MISCELLANEOUS) ×3 IMPLANT
SOL BSS BAG (MISCELLANEOUS) ×3
SOL PREP PVP 2OZ (MISCELLANEOUS) ×3
SOLUTION BSS BAG (MISCELLANEOUS) ×1 IMPLANT
SOLUTION PREP PVP 2OZ (MISCELLANEOUS) ×1 IMPLANT
SYR 3ML LL SCALE MARK (SYRINGE) ×3 IMPLANT
SYR 5ML LL (SYRINGE) ×3 IMPLANT
SYR TB 1ML 27GX1/2 LL (SYRINGE) ×3 IMPLANT
WATER STERILE IRR 1000ML POUR (IV SOLUTION) ×3 IMPLANT
WIPE NON LINTING 3.25X3.25 (MISCELLANEOUS) ×3 IMPLANT

## 2015-07-16 NOTE — H&P (Signed)
  All labs reviewed. Abnormal studies sent to patients PCP when indicated.  Previous H&P reviewed, patient examined, there are NO CHANGES.  Kristin Stuart LOUIS9/13/201611:16 AM

## 2015-07-16 NOTE — Anesthesia Preprocedure Evaluation (Signed)
Anesthesia Evaluation  Patient identified by MRN, date of birth, ID band  Reviewed: Allergy & Precautions, NPO status , Patient's Chart, lab work & pertinent test results, reviewed documented beta blocker date and time   History of Anesthesia Complications (+) PONV and history of anesthetic complications  Airway Mallampati: II  TM Distance: <3 FB Neck ROM: Full    Dental no notable dental hx. (+) Teeth Intact   Pulmonary former smoker,    Pulmonary exam normal        Cardiovascular hypertension, Pt. on medications and Pt. on home beta blockers Normal cardiovascular exam     Neuro/Psych  Headaches, TIA Neuromuscular disease (fibromyalgia) negative psych ROS   GI/Hepatic hiatal hernia, GERD  Medicated and Controlled,  Endo/Other  Hypothyroidism   Renal/GU      Musculoskeletal  (+) Arthritis , Osteoarthritis,  Fibromyalgia -  Abdominal Normal abdominal exam  (+)   Peds  Hematology   Anesthesia Other Findings Past Medical History:   Hypertension                                                 Hypothyroid                                                  Fibromyalgia                                                 Hypercholesteremia                                           Arthritis                                                    GERD (gastroesophageal reflux disease)                       TIA (transient ischemic attack)                 2012         History of hiatal hernia                                     PONV (postoperative nausea and vomiting)                     Edema                                                          Comment:LEGS/FEET   Stroke  Comment:tia   Anemia                                                       Post laminectomy syndrome                                    Edema                                                       Comment:legs/feet   Reproductive/Obstetrics                             Anesthesia Physical  Anesthesia Plan  ASA: III  Anesthesia Plan: MAC   Post-op Pain Management:    Induction:   Airway Management Planned: Nasal Cannula  Additional Equipment:   Intra-op Plan:   Post-operative Plan:   Informed Consent: I have reviewed the patients History and Physical, chart, labs and discussed the procedure including the risks, benefits and alternatives for the proposed anesthesia with the patient or authorized representative who has indicated his/her understanding and acceptance.   Dental advisory given  Plan Discussed with: CRNA and Surgeon  Anesthesia Plan Comments:         Anesthesia Quick Evaluation

## 2015-07-16 NOTE — Anesthesia Postprocedure Evaluation (Signed)
  Anesthesia Post-op Note  Patient: Kristin Stuart  Procedure(s) Performed: Procedure(s) with comments: CATARACT EXTRACTION PHACO AND INTRAOCULAR LENS PLACEMENT (IOC) (Left) - [place]: 00:21.5 AP%: 20.1 CDE: 4.31 Lot # [id number]  Anesthesia type:MAC  Patient location: PACU  Post pain: Pain level controlled  Post assessment: Post-op Vital signs reviewed, Patient's Cardiovascular Status Stable, Respiratory Function Stable, Patent Airway and No signs of Nausea or vomiting  Post vital signs: Reviewed and stable  Last Vitals:  Filed Vitals:   07/16/15 0951  BP: 158/83  Pulse: 73  Temp: 35.4 C  Resp: 16    Level of consciousness: awake, alert  and patient cooperative  Complications: No apparent anesthesia complications

## 2015-07-16 NOTE — Discharge Instructions (Signed)
FOLLOW DR PORFILIO'S POSTOP EYE DROP DISCHARGE INSTRUCTION SHEET AS REVIEWED  Eye Surgery Discharge Instructions  Expect mild scratchy sensation or mild soreness. DO NOT RUB YOUR EYE!  The day of surgery:  Minimal physical activity, but bed rest is not required  No reading, computer work, or close hand work  No bending, lifting, or straining.  May watch TV  For 24 hours:  No driving, legal decisions, or alcoholic beverages  Safety precautions  Eat anything you prefer: It is better to start with liquids, then soup then solid foods.  _____ Eye patch should be worn until postoperative exam tomorrow.  ____ Solar shield eyeglasses should be worn for comfort in the sunlight/patch while sleeping  Resume all regular medications including aspirin or Coumadin if these were discontinued prior to surgery. You may shower, bathe, shave, or wash your hair. Tylenol may be taken for mild discomfort.  Call your doctor if you experience significant pain, nausea, or vomiting, fever > 101 or other signs of infection. [phone] or [phone] Specific instructions:  Follow-up Information    Follow up with Carlena Bjornstad, MD.   Specialty:  Ophthalmology   Why:  07/17/15 @ 10:50 am   Contact information:   [address] [place] [address] [phone]

## 2015-07-16 NOTE — Transfer of Care (Signed)
Immediate Anesthesia Transfer of Care Note  Patient: Kristin Stuart  Procedure(s) Performed: Procedure(s) with comments: CATARACT EXTRACTION PHACO AND INTRAOCULAR LENS PLACEMENT (IOC) (Left) - [place]: 00:21.5 AP%: 20.1 CDE: 4.31 Lot # [id number]  Patient Location: PACU  Anesthesia Type:MAC  Level of Consciousness: awake, alert , oriented and patient cooperative  Airway & Oxygen Therapy: Patient Spontanous Breathing  Post-op Assessment: Report given to RN and Post -op Vital signs reviewed and stable  Post vital signs: Reviewed and stable  Last Vitals:  Filed Vitals:   07/16/15 0951  BP: 158/83  Pulse: 73  Temp: 35.4 C  Resp: 16    Complications: No apparent anesthesia complications

## 2015-07-16 NOTE — Op Note (Signed)
PREOPERATIVE DIAGNOSIS:  Nuclear sclerotic cataract of the left eye.   POSTOPERATIVE DIAGNOSIS:  left nuclear sclerotic cataract   OPERATIVE PROCEDURE:  Procedure(s): CATARACT EXTRACTION PHACO AND INTRAOCULAR LENS PLACEMENT (IOC)   SURGEON:  Galen Manila, MD.   ANESTHESIA:   Anesthesiologist: Lenard Simmer, MD CRNA: Darrol Jump, CRNA  1.      Managed anesthesia care. 2.      Topical tetracaine drops followed by 2% Xylocaine jelly applied in the preoperative holding area.   COMPLICATIONS:  None.   TECHNIQUE:   Stop and chop   DESCRIPTION OF PROCEDURE:  The patient was examined and consented in the preoperative holding area where the aforementioned topical anesthesia was applied to the left eye and then brought back to the Operating Room where the left eye was prepped and draped in the usual sterile ophthalmic fashion and a lid speculum was placed. A paracentesis was created with the side port blade and the anterior chamber was filled with viscoelastic. A near clear corneal incision was performed with the steel keratome. A continuous curvilinear capsulorrhexis was performed with a cystotome followed by the capsulorrhexis forceps. Hydrodissection and hydrodelineation were carried out with BSS on a blunt cannula. The lens was removed in a stop and chop  technique and the remaining cortical material was removed with the irrigation-aspiration handpiece. The capsular bag was inflated with viscoelastic and the Technis ZCB00 lens was placed in the capsular bag without complication. The remaining viscoelastic was removed from the eye with the irrigation-aspiration handpiece. The wounds were hydrated. The anterior chamber was flushed with Miostat and the eye was inflated to physiologic pressure. 0.1 mL of cefuroxime concentration 10 mg/mL was placed in the anterior chamber. The wounds were found to be water tight. The eye was dressed with Vigamox. The patient was given protective glasses to wear  throughout the day and a shield with which to sleep tonight. The patient was also given drops with which to begin a drop regimen today and will follow-up with me in one day.  Implant Name Type Inv. Item Serial No. Manufacturer Lot No. LRB No. Used  LENS IMPL INTRAOC ZCB00 23.0 - [id number] Intraocular Lens LENS IMPL INTRAOC ZCB00 23.0 [id number] AMO   Left 1   Procedure(s) with comments: CATARACT EXTRACTION PHACO AND INTRAOCULAR LENS PLACEMENT (IOC) (Left) - [place]: 00:21.5 AP%: 20.1 CDE: 4.31 Lot # [id number]  Electronically signed: Kariya Lavergne LOUIS 07/16/2015 11:45 AM

## 2016-03-20 ENCOUNTER — Other Ambulatory Visit: Payer: Self-pay | Admitting: Family Medicine

## 2016-03-20 DIAGNOSIS — Z1231 Encounter for screening mammogram for malignant neoplasm of breast: Secondary | ICD-10-CM

## 2016-03-25 ENCOUNTER — Ambulatory Visit: Payer: Medicare Other

## 2016-03-26 ENCOUNTER — Ambulatory Visit
Admission: RE | Admit: 2016-03-26 | Discharge: 2016-03-26 | Disposition: A | Discharge status: Home / Self Care | Payer: Medicare Other | Source: Ambulatory Visit | Attending: Family Medicine | Admitting: Family Medicine

## 2016-03-26 ENCOUNTER — Other Ambulatory Visit: Payer: Self-pay | Admitting: Family Medicine

## 2016-03-26 DIAGNOSIS — Z1231 Encounter for screening mammogram for malignant neoplasm of breast: Secondary | ICD-10-CM

## 2016-11-12 ENCOUNTER — Inpatient Hospital Stay: Admission: RE | Admit: 2016-11-12 | Payer: Medicare Other | Source: Ambulatory Visit

## 2016-11-19 ENCOUNTER — Ambulatory Visit: Admit: 2016-11-19 | Payer: Medicare Other | Admitting: Orthopedic Surgery

## 2016-11-19 SURGERY — CARPAL TUNNEL RELEASE
Anesthesia: Choice | Laterality: Left

## 2016-12-24 ENCOUNTER — Emergency Department
Admission: EM | Admit: 2016-12-24 | Discharge: 2016-12-24 | Disposition: A | Discharge status: Home / Self Care | Payer: Medicare Other | Attending: Emergency Medicine | Admitting: Emergency Medicine

## 2016-12-24 ENCOUNTER — Emergency Department: Payer: Medicare Other

## 2016-12-24 ENCOUNTER — Encounter: Payer: Self-pay | Admitting: Emergency Medicine

## 2016-12-24 DIAGNOSIS — H538 Other visual disturbances: Secondary | ICD-10-CM | POA: Insufficient documentation

## 2016-12-24 DIAGNOSIS — Z87891 Personal history of nicotine dependence: Secondary | ICD-10-CM | POA: Diagnosis not present

## 2016-12-24 DIAGNOSIS — Z79899 Other long term (current) drug therapy: Secondary | ICD-10-CM | POA: Diagnosis not present

## 2016-12-24 DIAGNOSIS — Z7982 Long term (current) use of aspirin: Secondary | ICD-10-CM | POA: Diagnosis not present

## 2016-12-24 DIAGNOSIS — R42 Dizziness and giddiness: Secondary | ICD-10-CM | POA: Insufficient documentation

## 2016-12-24 DIAGNOSIS — R51 Headache: Secondary | ICD-10-CM | POA: Diagnosis present

## 2016-12-24 DIAGNOSIS — E039 Hypothyroidism, unspecified: Secondary | ICD-10-CM | POA: Insufficient documentation

## 2016-12-24 DIAGNOSIS — I10 Essential (primary) hypertension: Secondary | ICD-10-CM | POA: Insufficient documentation

## 2016-12-24 DIAGNOSIS — R519 Headache, unspecified: Secondary | ICD-10-CM

## 2016-12-24 LAB — URINALYSIS, COMPLETE (UACMP) WITH MICROSCOPIC
BILIRUBIN URINE: NEGATIVE
Bacteria, UA: NONE SEEN
Glucose, UA: NEGATIVE mg/dL
Ketones, ur: NEGATIVE mg/dL
Leukocytes, UA: NEGATIVE
NITRITE: NEGATIVE
PH: 6 (ref 5.0–8.0)
Protein, ur: NEGATIVE mg/dL
Specific Gravity, Urine: 1.01 (ref 1.005–1.030)
Squamous Epithelial / LPF: NONE SEEN

## 2016-12-24 LAB — BASIC METABOLIC PANEL
Anion gap: 10 (ref 5–15)
BUN: 16 mg/dL (ref 6–20)
CALCIUM: 9 mg/dL (ref 8.9–10.3)
CO2: 26 mmol/L (ref 22–32)
Chloride: 103 mmol/L (ref 101–111)
Creatinine, Ser: 0.9 mg/dL (ref 0.44–1.00)
GFR calc Af Amer: >60 mL/min (ref >60)
Glucose, Bld: 123 mg/dL — ABNORMAL HIGH (ref 65–99)
Potassium: 3.8 mmol/L (ref 3.5–5.1)
Sodium: 139 mmol/L (ref 135–145)

## 2016-12-24 LAB — CBC
HCT: 37.1 % (ref 35.0–47.0)
Hemoglobin: 13.3 g/dL (ref 12.0–16.0)
MCH: 32.1 pg (ref 26.0–34.0)
MCHC: 35.8 g/dL (ref 32.0–36.0)
MCV: 89.7 fL (ref 80.0–100.0)
Platelets: 231 10*3/uL (ref 150–440)
RBC: 4.14 MIL/uL (ref 3.80–5.20)
RDW: 12.6 % (ref 11.5–14.5)
WBC: 3.7 10*3/uL (ref 3.6–11.0)

## 2016-12-24 MED ORDER — ACETAMINOPHEN 500 MG PO TABS
1000.0000 mg | ORAL_TABLET | Freq: Once | ORAL | Status: AC
  Administered 2016-12-24: 1000 mg via ORAL
  Filled 2016-12-24: qty 2

## 2016-12-24 NOTE — ED Notes (Signed)
Patient ambulated to triage for visual acuity test with a steady gait.

## 2016-12-24 NOTE — Discharge Instructions (Signed)
You have been seen in the Emergency Department (ED) for a headache. Your evaluation today was overall reassuring. Headaches have many possible causes. Most headaches aren't a sign of a more serious problem, and they will get better on their own.   Follow-up with your doctor in 12-24 hours if you are still having a headache. Otherwise follow up with your doctor in 3-5 days.  For pain take tylenol or ibuprofen. Follow up with your eye doctor in 1-2 days  When should you call for help?  Call 911 or return to the ED anytime you think you may need emergency care. For example, call if:  You have signs of a stroke. These may include:  Sudden numbness, paralysis, or weakness in your face, arm, or leg, especially on only one side of your body.  Sudden vision changes.  Sudden trouble speaking.  Sudden confusion or trouble understanding simple statements.  Sudden problems with walking or balance.  A sudden, severe headache that is different from past headaches. You have new or worsening headache Nausea and vomiting associated with your headache Fever, neck stiffness associated with your headache  Call your doctor now or seek immediate medical care if:  You have a new or worse headache.  Your headache gets much worse.  How can you care for yourself at home?  Do not drive if you have taken a prescription pain medicine.  Rest in a quiet, dark room until your headache is gone. Close your eyes and try to relax or go to sleep. Don't watch TV or read.  Put a cold, moist cloth or cold pack on the painful area for 10 to 20 minutes at a time. Put a thin cloth between the cold pack and your skin.  Use a warm, moist towel or a heating pad set on low to relax tight shoulder and neck muscles.  Have someone gently massage your neck and shoulders.  Take pain medicines exactly as directed.  If the doctor gave you a prescription medicine for pain, take it as prescribed.  If you are not taking a prescription pain  medicine, ask your doctor if you can take an over-the-counter medicine. Be careful not to take pain medicine more often than the instructions allow, because you may get worse or more frequent headaches when the medicine wears off.  Do not ignore new symptoms that occur with a headache, such as a fever, weakness or numbness, vision changes, or confusion. These may be signs of a more serious problem.  To prevent headaches  Keep a headache diary so you can figure out what triggers your headaches. Avoiding triggers may help you prevent headaches. Record when each headache began, how long it lasted, and what the pain was like (throbbing, aching, stabbing, or dull). Write down any other symptoms you had with the headache, such as nausea, flashing lights or dark spots, or sensitivity to bright light or loud noise. Note if the headache occurred near your period. List anything that might have triggered the headache, such as certain foods (chocolate, cheese, wine) or odors, smoke, bright light, stress, or lack of sleep.  Find healthy ways to deal with stress. Headaches are most common during or right after stressful times. Take time to relax before and after you do something that has caused a headache in the past.  Try to keep your muscles relaxed by keeping good posture. Check your jaw, face, neck, and shoulder muscles for tension, and try relaxing them. When sitting at a desk, change  often, and stretch for 30 seconds each hour.  °Get plenty of sleep and exercise.  °Eat regularly and well. Long periods without food can trigger a headache.  °Treat yourself to a massage. Some people find that regular massages are very helpful in relieving tension.  °Limit caffeine by not drinking too much coffee, tea, or soda. But don't quit caffeine suddenly, because that can also give you headaches.  °Reduce eyestrain from computers by blinking frequently and looking away from the computer screen every so often. Make sure  you have proper eyewear and that your monitor is set up properly, about an arm's length away.  °Seek help if you have depression or anxiety. Your headaches may be linked to these conditions. Treatment can both prevent headaches and help with symptoms of anxiety or depression. ° °

## 2016-12-24 NOTE — ED Triage Notes (Signed)
C/O blurred vision x 1 week. Seen by Urgent Care today, but  Sent to ED for evaluation.  Also c/o intermittently feeling "off balance". Denies pain.

## 2016-12-24 NOTE — ED Notes (Signed)
Patient ambulated to and from the room commode with a steady gait.

## 2016-12-24 NOTE — ED Provider Notes (Signed)
[facility] Emergency Department Provider Note  ____________________________________________  Time seen: Approximately 3:49 PM  I have reviewed the triage vital signs and the nursing notes.   HISTORY  Chief Complaint Blurred Vision and Dizziness   HPI Kristin Stuart is a 66 y.o. 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 Patient reports for the last week she has had blurry vision, intermittent episodes of lightheadedness, and daily headaches. She reports she currently has 6 out of 10 sharp headache located in the right side of her head. She reports that the intensity, quality, location of the headache varies for the last week. She denies history of chronic headaches. She has had cataract surgery in June 2016 and has not seen an eye doctor since then. She denies slurred speech, facial droop, difficulty finding words, unilateral weakness or numbness, gait instability. She reports that these episodes of lightheadedness and feeling faint can come both at rest or when she is walking. No neck pain, no paresthesias, no fever, no chills, no nausea, no vomiting. She reports that she hasn't tried anything at home for the headaches. She reports that the headaches resolve without intervention every day. She denies thunderclap headache.  Past Medical History:  Diagnosis Date  . Anemia   . Arthritis   . Edema    LEGS/FEET  . Edema    legs/feet  . Fibromyalgia   . GERD (gastroesophageal reflux disease)   . History of hiatal hernia   . Hypercholesteremia   . Hypertension   . Hypothyroid   . PONV (postoperative nausea and vomiting)   . Post laminectomy syndrome   . Stroke (HCC)    tia  . TIA (transient ischemic attack) 2012    Patient Active Problem List   Diagnosis Date Noted  . Bilateral occipital neuralgia 03/14/2015  . DDD (degenerative disc disease),  lumbar 03/14/2015  . Facet syndrome, lumbar 03/14/2015  . DDD (degenerative disc disease), cervical 03/14/2015  . Cervical facet joint syndrome 03/14/2015  . Migraine 03/14/2015    Past Surgical History:  Procedure Laterality Date  . ABDOMINAL HYSTERECTOMY  1975  . ANTERIOR CERVICAL DECOMP/DISCECTOMY FUSION  1993, 2011   C5-C6, C3-C4  . BACK SURGERY     x4  . BLADDER SUSPENSION  2010  . BREAST CYST EXCISION Right 1981  . BREAST EXCISIONAL BIOPSY Left 1980   NEG  . BREAST LUMPECTOMY Left 1980  . CATARACT EXTRACTION W/PHACO Right 06/25/2015   Procedure: CATARACT EXTRACTION PHACO AND INTRAOCULAR LENS PLACEMENT (IOC);  Surgeon: Galen ManilaWilliam Porfilio, MD;  Location: [facility] ORS;  Service: Ophthalmology;  Laterality: Right;  US:00:54.9 AP:25.9 CDE:14.22 LOT PAK #[id number]  . CATARACT EXTRACTION W/PHACO Left 07/16/2015   Procedure: CATARACT EXTRACTION PHACO AND INTRAOCULAR LENS PLACEMENT (IOC);  Surgeon: Galen ManilaWilliam Porfilio, MD;  Location: [facility] ORS;  Service: Ophthalmology;  Laterality: Left;  US: 00:21.5 AP%: 20.1 CDE: 4.31 Lot # [id number]  . EXCISIONAL HEMORRHOIDECTOMY  2014  . LUMBAR DISC SURGERY  1999   L4-L5  . POLYPECTOMY  20014   3 polyps removed from large intestines  . TUBAL LIGATION  1974    Prior to Admission medications   Medication Sig Start Date End Date Taking? Authorizing Provider  acetaminophen-codeine (TYLENOL #3) 300-30 MG tablet Take 1 tablet by mouth 2 (two) times daily as needed for moderate pain.   Yes Historical Provider, MD  aspirin 81 MG tablet Take 81 mg by mouth daily.  Yes Historical Provider, MD  Cetirizine HCl 10 MG CAPS Take 10 mg by mouth daily as needed (allergies).    Yes Historical Provider, MD  cyclobenzaprine (FLEXERIL) 10 MG tablet Take 10-20 mg by mouth 2 (two) times daily. 1 in am and 2 at hs    Yes Historical Provider, MD  fluticasone (FLONASE) 50 MCG/ACT nasal spray Place 1 spray into both nostrils daily as needed for allergies.    Yes Historical  Provider, MD  levothyroxine (SYNTHROID, LEVOTHROID) 125 MCG tablet Take 125 mcg by mouth daily before breakfast.   Yes Historical Provider, MD  meloxicam (MOBIC) 15 MG tablet Take 15 mg by mouth daily. 12/15/16 10/06/17 Yes Historical Provider, MD  metoprolol succinate (TOPROL-XL) 50 MG 24 hr tablet Take 50 mg by mouth daily. Take with or immediately following a meal.   Yes Historical Provider, MD  Oxcarbazepine (TRILEPTAL) 300 MG tablet Take 300 mg by mouth 2 (two) times daily.   Yes Historical Provider, MD  pregabalin (LYRICA) 50 MG capsule Take 50 mg by mouth 2 (two) times daily. 01/24/16 01/23/17 Yes Historical Provider, MD  simvastatin (ZOCOR) 20 MG tablet Take 20 mg by mouth at bedtime. 03/23/16 03/23/17 Yes Historical Provider, MD  Tetrahydrozoline HCl (VISINE OP) Apply 1 drop to eye daily as needed (redness).   Yes Historical Provider, MD    Allergies Norvasc [amlodipine] and Penicillins  Family History  Problem Relation Age of Onset  . Kidney disease Mother   . Varicose Veins Mother   . Cancer Father   . COPD Father   . Breast cancer Neg Hx     Social History Social History  Substance Use Topics  . Smoking status: Former Smoker    Quit date: 03/13/1998  . Smokeless tobacco: Never Used  . Alcohol use 0.0 oz/week     Comment: occasionally    Review of Systems  Constitutional: Negative for fever. Eyes: + blurry vision ENT: Negative for sore throat. Neck: No neck pain  Cardiovascular: Negative for chest pain. Respiratory: Negative for shortness of breath. Gastrointestinal: Negative for abdominal pain, vomiting or diarrhea. Genitourinary: Negative for dysuria. Musculoskeletal: Negative for back pain. Skin: Negative for rash. Neurological: Negative for weakness or numbness. + HA Psych: No SI or HI  ____________________________________________   PHYSICAL EXAM:  VITAL SIGNS: Vitals:   12/24/16 1509 12/24/16 1857  BP: (!) 163/80 (!) 186/88  Pulse: 94 83  Resp: 18 18    Temp:     Constitutional: Alert and oriented. Well appearing and in no apparent distress. HEENT:      Head: Normocephalic and atraumatic.         Eyes: Conjunctivae are normal. Sclera is non-icteric. EOMI. PERRL      Mouth/Throat: Mucous membranes are moist.       Neck: Supple with no signs of meningismus. Cardiovascular: Regular rate and rhythm. No murmurs, gallops, or rubs. 2+ symmetrical distal pulses are present in all extremities. No JVD. Respiratory: Normal respiratory effort. Lungs are clear to auscultation bilaterally. No wheezes, crackles, or rhonchi.  Gastrointestinal: Soft, non tender, and non distended with positive bowel sounds. No rebound or guarding. Genitourinary: No CVA tenderness. Musculoskeletal: Nontender with normal range of motion in all extremities. No edema, cyanosis, or erythema of extremities. Neurologic: Normal speech and language. A & O x3, intact visual fields, visual acuity 20/50 bilaterally, PERRL, no nystagmus, CN II-XII intact, motor testing reveals good tone and bulk throughout. There is no evidence of pronator drift or dysmetria. Muscle strength is  5/5 throughout. Deep tendon reflexes are 2+ throughout with downgoing toes. Sensory examination is intact. Gait is normal. Skin: Skin is warm, dry and intact. No rash noted. Psychiatric: Mood and affect are normal. Speech and behavior are normal.  ____________________________________________   LABS (all labs ordered are listed, but only abnormal results are displayed)  Labs Reviewed  BASIC METABOLIC PANEL - Abnormal; Notable for the following:       Result Value   Glucose, Bld 123 (*)    All other components within normal limits  URINALYSIS, COMPLETE (UACMP) WITH MICROSCOPIC - Abnormal; Notable for the following:    Color, Urine YELLOW (*)    APPearance CLEAR (*)    Hgb urine dipstick SMALL (*)    All other components within normal limits  CBC  CBG MONITORING, ED    ____________________________________________  EKG  ED ECG REPORT I, Nita Sickle, the attending physician, personally viewed and interpreted this ECG.   Normal sinus rhythm, rate of 95, normal intervals, normal axis, no ST elevations or depressions. Unchanged from prior ____________________________________________  RADIOLOGY  Head CT: Mild atrophy and small right basilar ganglial lacunar infarct. No definite acute abnormality.  MRI brain: 1. No acute intracranial abnormality. 2. Minimal chronic microvascular ischemic changes of the brain. ____________________________________________   PROCEDURES  Procedure(s) performed: None Procedures Critical Care performed:  None ____________________________________________   INITIAL IMPRESSION / ASSESSMENT AND PLAN / ED COURSE   66 y.o. 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 No vertigo, neurologically intact with intact visual fields, pupils equal round and reactive, intact extraocular movements, normal gait, no ataxia, no dysmetria. Since patient has been having symptoms for a week ago but only that head CT is an appropriate test because if she had a stroke one week ago at the onset of symptoms that should show on the CT. Low suspicion with complete neuro intact exam and no evidence of posterior fossa stroke. Patient had cataract surgery 1.5 year ago and has not seen her ophthalmologist since then. If CT is negative we'll refer her back to her eye doctor.  Clinical Course as of Dec 25 1911  Thu Dec 24, 2016  1908 CT with questionable small right basilar ganglial lacunar infarct. MRI done which is negative for stroke or any other acute pathology. Visual acuity 20/50 bilaterally. Patient will be referred to her PCP and ophthalmologist for further management.   [CV]    Clinical Course User Index [CV] Nita Sickle, MD    Pertinent labs & imaging results that were available during my care of the patient were reviewed by me and considered in my medical decision making (see chart for details).    ____________________________________________   FINAL CLINICAL IMPRESSION(S) / ED DIAGNOSES  Final diagnoses:  Blurry vision, bilateral  Acute nonintractable headache, unspecified headache type      NEW MEDICATIONS STARTED DURING THIS VISIT:  New Prescriptions   No medications on file     Note:  This document was prepared using Dragon voice recognition software and may include unintentional dictation errors.    Nita Sickle, MD 12/24/16 [phone]

## 2016-12-24 NOTE — ED Notes (Signed)
Patient went to MRI.

## 2016-12-24 NOTE — ED Notes (Signed)
Patient given graham crackers, peanut butter and a drink per patient's request. MD aware.

## 2017-04-06 ENCOUNTER — Other Ambulatory Visit: Payer: Self-pay | Admitting: Family Medicine

## 2017-04-06 DIAGNOSIS — Z780 Asymptomatic menopausal state: Secondary | ICD-10-CM

## 2017-10-16 ENCOUNTER — Other Ambulatory Visit: Payer: Self-pay

## 2017-10-16 ENCOUNTER — Emergency Department
Admission: EM | Admit: 2017-10-16 | Discharge: 2017-10-16 | Disposition: A | Discharge status: Home / Self Care | Payer: Medicare Other | Attending: Emergency Medicine | Admitting: Emergency Medicine

## 2017-10-16 ENCOUNTER — Encounter: Payer: Self-pay | Admitting: Emergency Medicine

## 2017-10-16 DIAGNOSIS — Z87891 Personal history of nicotine dependence: Secondary | ICD-10-CM | POA: Diagnosis not present

## 2017-10-16 DIAGNOSIS — Z79899 Other long term (current) drug therapy: Secondary | ICD-10-CM | POA: Diagnosis not present

## 2017-10-16 DIAGNOSIS — Z8673 Personal history of transient ischemic attack (TIA), and cerebral infarction without residual deficits: Secondary | ICD-10-CM | POA: Insufficient documentation

## 2017-10-16 DIAGNOSIS — Z7982 Long term (current) use of aspirin: Secondary | ICD-10-CM | POA: Diagnosis not present

## 2017-10-16 DIAGNOSIS — G8929 Other chronic pain: Secondary | ICD-10-CM | POA: Diagnosis not present

## 2017-10-16 DIAGNOSIS — E039 Hypothyroidism, unspecified: Secondary | ICD-10-CM | POA: Insufficient documentation

## 2017-10-16 DIAGNOSIS — R51 Headache: Secondary | ICD-10-CM | POA: Insufficient documentation

## 2017-10-16 DIAGNOSIS — I10 Essential (primary) hypertension: Secondary | ICD-10-CM | POA: Diagnosis not present

## 2017-10-16 DIAGNOSIS — H538 Other visual disturbances: Secondary | ICD-10-CM | POA: Diagnosis present

## 2017-10-16 LAB — URINALYSIS, COMPLETE (UACMP) WITH MICROSCOPIC
BACTERIA UA: NONE SEEN
BILIRUBIN URINE: NEGATIVE
Glucose, UA: NEGATIVE mg/dL
Hgb urine dipstick: NEGATIVE
Ketones, ur: NEGATIVE mg/dL
Leukocytes, UA: NEGATIVE
Nitrite: NEGATIVE
Protein, ur: NEGATIVE mg/dL
Specific Gravity, Urine: 1.011 (ref 1.005–1.030)
Squamous Epithelial / LPF: NONE SEEN
pH: 6 (ref 5.0–8.0)

## 2017-10-16 LAB — CBC WITH DIFFERENTIAL/PLATELET
BASOS ABS: 0 10*3/uL (ref 0–0.1)
Basophils Relative: 1 %
EOS ABS: 0.2 10*3/uL (ref 0–0.7)
EOS PCT: 5 %
HCT: 38.1 % (ref 35.0–47.0)
Hemoglobin: 12.8 g/dL (ref 12.0–16.0)
LYMPHS PCT: 46 %
Lymphs Abs: 1.6 10*3/uL (ref 1.0–3.6)
MCH: 31.3 pg (ref 26.0–34.0)
MCHC: 33.7 g/dL (ref 32.0–36.0)
MCV: 92.7 fL (ref 80.0–100.0)
MONO ABS: 0.3 10*3/uL (ref 0.2–0.9)
Monocytes Relative: 10 %
Neutro Abs: 1.4 10*3/uL (ref 1.4–6.5)
Neutrophils Relative %: 40 %
PLATELETS: 250 10*3/uL (ref 150–440)
RBC: 4.11 MIL/uL (ref 3.80–5.20)
RDW: 12.6 % (ref 11.5–14.5)
WBC: 3.4 10*3/uL — AB (ref 3.6–11.0)

## 2017-10-16 LAB — BASIC METABOLIC PANEL
ANION GAP: 9 (ref 5–15)
BUN: 15 mg/dL (ref 6–20)
CHLORIDE: 103 mmol/L (ref 101–111)
CO2: 26 mmol/L (ref 22–32)
Calcium: 8.9 mg/dL (ref 8.9–10.3)
Creatinine, Ser: 0.74 mg/dL (ref 0.44–1.00)
GFR calc non Af Amer: >60 mL/min (ref >60)
Glucose, Bld: 92 mg/dL (ref 65–99)
POTASSIUM: 4.4 mmol/L (ref 3.5–5.1)
SODIUM: 138 mmol/L (ref 135–145)

## 2017-10-16 LAB — URINE DRUG SCREEN, QUALITATIVE (ARMC ONLY)
Amphetamines, Ur Screen: NOT DETECTED
BARBITURATES, UR SCREEN: NOT DETECTED
Benzodiazepine, Ur Scrn: NOT DETECTED
Cannabinoid 50 Ng, Ur ~~LOC~~: NOT DETECTED
Cocaine Metabolite,Ur ~~LOC~~: NOT DETECTED
MDMA (Ecstasy)Ur Screen: NOT DETECTED
METHADONE SCREEN, URINE: NOT DETECTED
Opiate, Ur Screen: POSITIVE — AB
Phencyclidine (PCP) Ur S: NOT DETECTED
TRICYCLIC, UR SCREEN: NOT DETECTED

## 2017-10-16 LAB — TSH: TSH: 4.488 u[IU]/mL (ref 0.350–4.500)

## 2017-10-16 NOTE — ED Provider Notes (Addendum)
[facility] Emergency Department Provider Note  ____________________________________________   I have reviewed the triage vital signs and the nursing notes. Where available I have reviewed prior notes and, if possible and indicated, outside hospital notes.    HISTORY  Chief Complaint Visual Field Change and Headache    HPI Kristin Stuart is a 66 y.o. female history of fibromyalgia, chronic back pain, thyroid issues, hypertension, recurrent headaches for over a year, self described issues with anxiety sometimes, presents today complaining of blurry vision.  Patient states that she has had blurry vision off and on for the last year and longer.  She was seen here in February for the same complaint.  She states that she has had headaches and blurry vision off and on every week since that time.  She denies any fever chills or focal neurologic deficit.  This is the exact same symptoms she has had for well over a year.  She did follow-up with a primary care doctor she did not follow-up with her eye doctor.  She has not recently had her eye prescription checked prior she had it checked in the last year.  Patient states that she sometimes gets headaches when she wears her glasses too long and she has blurry vision accompanying that.  She denies any other symptoms.  She is very anxious about her blood pressure she states.  She wants to know if it has come down since she is arrived.  She is compliant with her blood pressure medications and had her blood pressure medications increased last week.     Past Medical History:  Diagnosis Date  . Anemia   . Arthritis   . Edema    LEGS/FEET  . Edema    legs/feet  . Fibromyalgia   . GERD (gastroesophageal reflux disease)   . History of hiatal hernia   . Hypercholesteremia   . Hypertension   . Hypothyroid   . PONV (postoperative nausea and vomiting)   . Post laminectomy syndrome   . Stroke (HCC)    tia  . TIA (transient  ischemic attack) 2012    Patient Active Problem List   Diagnosis Date Noted  . Bilateral occipital neuralgia 03/14/2015  . DDD (degenerative disc disease), lumbar 03/14/2015  . Facet syndrome, lumbar 03/14/2015  . DDD (degenerative disc disease), cervical 03/14/2015  . Cervical facet joint syndrome 03/14/2015  . Migraine 03/14/2015    Past Surgical History:  Procedure Laterality Date  . ABDOMINAL HYSTERECTOMY  1975  . ANTERIOR CERVICAL DECOMP/DISCECTOMY FUSION  1993, 2011   C5-C6, C3-C4  . BACK SURGERY     x4  . BLADDER SUSPENSION  2010  . BREAST CYST EXCISION Right 1981  . BREAST EXCISIONAL BIOPSY Left 1980   NEG  . BREAST LUMPECTOMY Left 1980  . CATARACT EXTRACTION W/PHACO Right 06/25/2015   Procedure: CATARACT EXTRACTION PHACO AND INTRAOCULAR LENS PLACEMENT (IOC);  Surgeon: Galen ManilaWilliam Porfilio, MD;  Location: [facility] ORS;  Service: Ophthalmology;  Laterality: Right;  US:00:54.9 AP:25.9 CDE:14.22 LOT PAK #[id number]  . CATARACT EXTRACTION W/PHACO Left 07/16/2015   Procedure: CATARACT EXTRACTION PHACO AND INTRAOCULAR LENS PLACEMENT (IOC);  Surgeon: Galen ManilaWilliam Porfilio, MD;  Location: [facility] ORS;  Service: Ophthalmology;  Laterality: Left;  US: 00:21.5 AP%: 20.1 CDE: 4.31 Lot # [id number]  . EXCISIONAL HEMORRHOIDECTOMY  2014  . LUMBAR DISC SURGERY  1999   L4-L5  . POLYPECTOMY  20014   3 polyps removed from large intestines  . TUBAL LIGATION  1974    Prior  to Admission medications   Medication Sig Start Date End Date Taking? Authorizing Provider  acetaminophen-codeine (TYLENOL #3) 300-30 MG tablet Take 1 tablet by mouth 2 (two) times daily as needed for moderate pain.   Yes [provider]  aspirin 81 MG tablet Take 81 mg by mouth daily.   Yes [provider]  cetirizine (ZYRTEC) 10 MG tablet Take 10 mg by mouth daily.   Yes [provider]  fluticasone (FLONASE) 50 MCG/ACT nasal spray Place 1 spray into both nostrils daily as needed for allergies.    Yes  [provider]  levothyroxine (SYNTHROID, LEVOTHROID) 125 MCG tablet Take 112 mcg by mouth daily before breakfast.    Yes [provider]  metoprolol succinate (TOPROL-XL) 50 MG 24 hr tablet Take 100 mg by mouth daily. Take with or immediately following a meal.    Yes [provider]  Oxcarbazepine (TRILEPTAL) 300 MG tablet Take 300 mg by mouth 2 (two) times daily.   Yes [provider]  simvastatin (ZOCOR) 20 MG tablet Take 20 mg by mouth at bedtime. 03/23/16 10/16/17 Yes [provider]  Tetrahydrozoline HCl (VISINE OP) Apply 1 drop to eye daily as needed (redness).   Yes [provider]    Allergies Norvasc [amlodipine] and Penicillins  Family History  Problem Relation Age of Onset  . Kidney disease Mother   . Varicose Veins Mother   . Cancer Father   . COPD Father   . Breast cancer Neg Hx     Social History Social History   Tobacco Use  . Smoking status: Former Smoker    Last attempt to quit: 03/13/1998    Years since quitting: 19.6  . Smokeless tobacco: Never Used  Substance Use Topics  . Alcohol use: Yes    Alcohol/week: 0.0 oz    Comment: occasionally  . Drug use: No    Review of Systems Constitutional: No fever/chills Eyes: No visual changes. ENT: No sore throat. No stiff neck no neck pain Cardiovascular: Denies chest pain. Respiratory: Denies shortness of breath. Gastrointestinal:   no vomiting.  No diarrhea.  No constipation. Genitourinary: Negative for dysuria. Musculoskeletal: Negative lower extremity swelling Skin: Negative for rash. Neurological: Negative for severe headaches, focal weakness or numbness.   ____________________________________________   PHYSICAL EXAM:  VITAL SIGNS: ED Triage Vitals  Enc Vitals Group     BP 10/16/17 1957 (!) 175/83     Pulse Rate 10/16/17 1957 68     Resp 10/16/17 1957 17     Temp 10/16/17 1957 98.4 F (36.9 C)     Temp Source 10/16/17 1957 Oral     SpO2  10/16/17 1957 95 %     Weight 10/16/17 1957 161 lb (73 kg)     Height 10/16/17 1957 5\' 6"  (1.676 m)     Head Circumference --      Peak Flow --      Pain Score 10/16/17 1956 6     Pain Loc --      Pain Edu? --      Excl. in GC? --     Constitutional: Alert and oriented. Well appearing and in no acute distress. Eyes: Conjunctivae are normal Head: Atraumatic HEENT: No congestion/rhinnorhea. Mucous membranes are moist.  Oropharynx non-erythematous Neck:   Nontender with no meningismus, no masses, no stridor Cardiovascular: Normal rate, regular rhythm. Grossly normal heart sounds.  Good peripheral circulation. Respiratory: Normal respiratory effort.  No retractions. Lungs CTAB. Abdominal: Soft and nontender. No  distention. No guarding no rebound Back:  There is no focal tenderness or step off.  there is no midline tenderness there are no lesions noted. there is no CVA tenderness Musculoskeletal: No lower extremity tenderness, no upper extremity tenderness. No joint effusions, no DVT signs strong distal pulses no edema Neurologic: Cranial nerves II through XII are grossly intact 5 out of 5 strength bilateral upper and lower extremity. Finger to nose within normal limits heel to shin within normal limits, speech is normal with no word finding difficulty or dysarthria, reflexes symmetric, pupils are equally round and reactive to light, there is no pronator drift, sensation is normal, vision is intact to confrontation, gait is deferred, there is no nystagmus, normal neurologic exam Skin:  Skin is warm, dry and intact. No rash noted. Psychiatric: Mood and affect are anxious. Speech and behavior are normal.  ____________________________________________   LABS (all labs ordered are listed, but only abnormal results are displayed)  Labs Reviewed  CBC WITH DIFFERENTIAL/PLATELET - Abnormal; Notable for the following components:      Result Value   WBC 3.4 (*)    All other components within normal  limits  BASIC METABOLIC PANEL  URINALYSIS, COMPLETE (UACMP) WITH MICROSCOPIC  URINE DRUG SCREEN, QUALITATIVE ([facility] ONLY)  TSH    Pertinent labs  results that were available during my care of the patient were reviewed by me and considered in my medical decision making (see chart for details). ____________________________________________  EKG  I personally interpreted any EKGs ordered by me or triage Sinus rhythm rate 59 bpm no acute ST elevation or depression normal axis, ____________________________________________  RADIOLOGY  Pertinent labs & imaging results that were available during my care of the patient were reviewed by me and considered in my medical decision making (see chart for details). If possible, patient and/or family made aware of any abnormal findings.  No results found. ____________________________________________    PROCEDURES  Procedure(s) performed: None  Procedures  Critical Care performed: None  ____________________________________________   INITIAL IMPRESSION / ASSESSMENT AND PLAN / ED COURSE  Pertinent labs & imaging results that were available during my care of the patient were reviewed by me and considered in my medical decision making (see chart for details).  Patient here with a years worth of occasional non-acute headaches and occasional blurry vision.  She has had the symptoms this time uninterruptedly for 3 weeks.  She has a normal neurologic exam with an NIH stroke scale of 0.  Patient is in no acute distress.  I do not think at this time further evaluation is likely to produce a cause for her symptoms which have been going on for over a year.  Patient is very anxious and repeatedly looking at the blood pressure cuff, however her blood pressure is not markedly high certainly compared to the baseline that we have.  We will continue to check it here I think as she comes down, we will most likely have some success in having it come down on its own.   I suspect it is somewhat mediated by anxiety.  We will check visual acuity although again no evidence of visual field cut, and chronicity of symptoms makes CVA not likely to be a component of this.  There is no evidence at this time of retinal hemorrhage or CVA or arterial blockage etc.  In addition, we will check thyroid tests, we will obtain TSH urinalysis and we will reassess.  I think given how long the symptoms have gone on  patient will be safe for discharge.  I do not think emergent imaging is required for this patient she had an MRI for these exact same symptoms last time she was here in the symptoms of essentially been ongoing since that time with no evidence of deterioration or further change.  I do suspect is possible that her glasses might need to be changed and she will follow closely with ophthalmology.  ----------------------------------------- 9:42 PM on 10/16/2017 -----------------------------------------  Patient now that she is by her own admission much more relaxed has a blood pressure in the 140s which is consistent with her baseline, she states her vision is fine now that she is relaxed and she is eager to go home.     Pt remains at neurologic baseline. At this time, there is nothing to suggest or support the diagnosis of subarachnoid hemorrhage, aneurysmal event, meningitis, tumor or mass, cavernous thrombosis, encephalitis, ischemic stroke, pseudotumor cerebri, glaucoma, temporal arteritis, or any other acute intracrania/neurological process. Extensive return precautions including but not limited to any new or worrisome symptoms such as worsening of, or change in, headache, any neurological symptoms, fever etc.. Natural disease course discussed with patient. The need for follow-up and all of my customary return precautions have been discussed as well.   ____________________________________________   FINAL CLINICAL IMPRESSION(S) / ED DIAGNOSES  Final diagnoses:  None       This chart was dictated using voice recognition software.  Despite best efforts to proofread,  errors can occur which can change meaning.      Jeanmarie Plant, MD 10/16/17 2100    Jeanmarie Plant, MD 10/16/17 [phone]

## 2017-10-16 NOTE — ED Triage Notes (Addendum)
Pt reports blurry vision since waking this am; says her vision has looked foggy all day; reports mild headache she reports as pressure; denies difficulty walking/chewing/swallowing/holding onto things; talking in complete coherent sentences; pt says she had some medication dosage changes in the last week or so for HTN; pt says she worked over time today and was able to finish her shift before coming in for evaluation

## 2018-04-07 ENCOUNTER — Other Ambulatory Visit: Payer: Self-pay | Admitting: Family Medicine

## 2018-04-07 DIAGNOSIS — Z780 Asymptomatic menopausal state: Secondary | ICD-10-CM

## 2018-04-07 DIAGNOSIS — Z1231 Encounter for screening mammogram for malignant neoplasm of breast: Secondary | ICD-10-CM

## 2018-05-09 ENCOUNTER — Ambulatory Visit
Admission: RE | Admit: 2018-05-09 | Discharge: 2018-05-09 | Disposition: A | Discharge status: Home / Self Care | Payer: Medicare HMO | Source: Ambulatory Visit | Attending: Family Medicine | Admitting: Family Medicine

## 2018-05-09 DIAGNOSIS — Z780 Asymptomatic menopausal state: Secondary | ICD-10-CM

## 2018-05-09 DIAGNOSIS — Z1231 Encounter for screening mammogram for malignant neoplasm of breast: Secondary | ICD-10-CM | POA: Diagnosis present

## 2018-08-15 ENCOUNTER — Other Ambulatory Visit: Payer: Self-pay | Admitting: Student

## 2018-08-15 DIAGNOSIS — M7581 Other shoulder lesions, right shoulder: Secondary | ICD-10-CM

## 2018-08-15 DIAGNOSIS — M19011 Primary osteoarthritis, right shoulder: Secondary | ICD-10-CM

## 2018-08-28 ENCOUNTER — Ambulatory Visit
Admission: RE | Admit: 2018-08-28 | Discharge: 2018-08-28 | Disposition: A | Discharge status: Home / Self Care | Payer: Medicare HMO | Source: Ambulatory Visit | Attending: Student | Admitting: Student

## 2018-08-28 DIAGNOSIS — M19011 Primary osteoarthritis, right shoulder: Secondary | ICD-10-CM | POA: Diagnosis not present

## 2018-08-28 DIAGNOSIS — M7581 Other shoulder lesions, right shoulder: Secondary | ICD-10-CM | POA: Diagnosis not present

## 2018-08-28 DIAGNOSIS — M7551 Bursitis of right shoulder: Secondary | ICD-10-CM | POA: Insufficient documentation

## 2018-08-28 DIAGNOSIS — X58XXXA Exposure to other specified factors, initial encounter: Secondary | ICD-10-CM | POA: Diagnosis not present

## 2018-08-28 DIAGNOSIS — M75101 Unspecified rotator cuff tear or rupture of right shoulder, not specified as traumatic: Secondary | ICD-10-CM | POA: Insufficient documentation

## 2018-08-28 DIAGNOSIS — S46811A Strain of other muscles, fascia and tendons at shoulder and upper arm level, right arm, initial encounter: Secondary | ICD-10-CM | POA: Insufficient documentation

## 2018-10-28 ENCOUNTER — Other Ambulatory Visit: Payer: Self-pay

## 2018-10-28 ENCOUNTER — Emergency Department: Payer: Medicare HMO

## 2018-10-28 ENCOUNTER — Emergency Department
Admission: EM | Admit: 2018-10-28 | Discharge: 2018-10-28 | Disposition: A | Discharge status: Home / Self Care | Payer: Medicare HMO | Attending: Emergency Medicine | Admitting: Emergency Medicine

## 2018-10-28 ENCOUNTER — Encounter: Payer: Self-pay | Admitting: Emergency Medicine

## 2018-10-28 DIAGNOSIS — I10 Essential (primary) hypertension: Secondary | ICD-10-CM | POA: Diagnosis not present

## 2018-10-28 DIAGNOSIS — R079 Chest pain, unspecified: Secondary | ICD-10-CM | POA: Insufficient documentation

## 2018-10-28 DIAGNOSIS — Z79899 Other long term (current) drug therapy: Secondary | ICD-10-CM | POA: Insufficient documentation

## 2018-10-28 DIAGNOSIS — E039 Hypothyroidism, unspecified: Secondary | ICD-10-CM | POA: Diagnosis not present

## 2018-10-28 DIAGNOSIS — R0789 Other chest pain: Secondary | ICD-10-CM

## 2018-10-28 DIAGNOSIS — J209 Acute bronchitis, unspecified: Secondary | ICD-10-CM | POA: Diagnosis not present

## 2018-10-28 DIAGNOSIS — R0602 Shortness of breath: Secondary | ICD-10-CM | POA: Diagnosis present

## 2018-10-28 DIAGNOSIS — Z87891 Personal history of nicotine dependence: Secondary | ICD-10-CM | POA: Insufficient documentation

## 2018-10-28 DIAGNOSIS — Z7982 Long term (current) use of aspirin: Secondary | ICD-10-CM | POA: Diagnosis not present

## 2018-10-28 LAB — BASIC METABOLIC PANEL
ANION GAP: 14 (ref 5–15)
BUN: 18 mg/dL (ref 8–23)
CALCIUM: 9.3 mg/dL (ref 8.9–10.3)
CO2: 22 mmol/L (ref 22–32)
Chloride: 95 mmol/L — ABNORMAL LOW (ref 98–111)
Creatinine, Ser: 1.26 mg/dL — ABNORMAL HIGH (ref 0.44–1.00)
GFR calc Af Amer: 50 mL/min — ABNORMAL LOW (ref >60)
GFR calc non Af Amer: 43 mL/min — ABNORMAL LOW (ref >60)
Glucose, Bld: 135 mg/dL — ABNORMAL HIGH (ref 70–99)
Potassium: 4.2 mmol/L (ref 3.5–5.1)
Sodium: 131 mmol/L — ABNORMAL LOW (ref 135–145)

## 2018-10-28 LAB — CBC
HCT: 42.6 % (ref 36.0–46.0)
HEMOGLOBIN: 14.9 g/dL (ref 12.0–15.0)
MCH: 30.3 pg (ref 26.0–34.0)
MCHC: 35 g/dL (ref 30.0–36.0)
MCV: 86.6 fL (ref 80.0–100.0)
Platelets: 357 10*3/uL (ref 150–400)
RBC: 4.92 MIL/uL (ref 3.87–5.11)
RDW: 11.1 % — ABNORMAL LOW (ref 11.5–15.5)
WBC: 4.1 10*3/uL (ref 4.0–10.5)
nRBC: 0 % (ref 0.0–0.2)

## 2018-10-28 LAB — TROPONIN I: Troponin I: <0.03 ng/mL (ref <0.03)

## 2018-10-28 LAB — FIBRIN DERIVATIVES D-DIMER (ARMC ONLY): Fibrin derivatives D-dimer (ARMC): 360.97 ng/mL (FEU) (ref 0.00–499.00)

## 2018-10-28 MED ORDER — KETOROLAC TROMETHAMINE 30 MG/ML IJ SOLN
30.0000 mg | Freq: Once | INTRAMUSCULAR | Status: AC
  Administered 2018-10-28: 30 mg via INTRAVENOUS
  Filled 2018-10-28: qty 1

## 2018-10-28 MED ORDER — ALBUTEROL SULFATE (2.5 MG/3ML) 0.083% IN NEBU
5.0000 mg | INHALATION_SOLUTION | Freq: Once | RESPIRATORY_TRACT | Status: AC
  Administered 2018-10-28: 5 mg via RESPIRATORY_TRACT
  Filled 2018-10-28: qty 6

## 2018-10-28 MED ORDER — METHYLPREDNISOLONE SODIUM SUCC 125 MG IJ SOLR
125.0000 mg | Freq: Once | INTRAMUSCULAR | Status: AC
  Administered 2018-10-28: 125 mg via INTRAVENOUS
  Filled 2018-10-28: qty 2

## 2018-10-28 MED ORDER — ALBUTEROL SULFATE HFA 108 (90 BASE) MCG/ACT IN AERS: 2.0000 | INHALATION_SPRAY | RESPIRATORY_TRACT | 0 refills | Status: DC | PRN

## 2018-10-28 MED ORDER — CYCLOBENZAPRINE HCL 10 MG PO TABS
10.0000 mg | ORAL_TABLET | Freq: Once | ORAL | Status: AC
  Administered 2018-10-28: 10 mg via ORAL
  Filled 2018-10-28: qty 1

## 2018-10-28 MED ORDER — PREDNISONE 20 MG PO TABS: 40.0000 mg | ORAL_TABLET | Freq: Every day | ORAL | 0 refills | Status: AC

## 2018-10-28 MED ORDER — SODIUM CHLORIDE 0.9 % IV BOLUS: 30.0000 mL/kg | Freq: Once | INTRAVENOUS | Status: DC

## 2018-10-28 MED ORDER — ONDANSETRON HCL 4 MG/2ML IJ SOLN
4.0000 mg | Freq: Once | INTRAMUSCULAR | Status: AC
  Administered 2018-10-28: 4 mg via INTRAVENOUS
  Filled 2018-10-28: qty 2

## 2018-10-28 MED ORDER — SODIUM CHLORIDE 0.9 % IV BOLUS
500.0000 mL | Freq: Once | INTRAVENOUS | Status: AC
  Administered 2018-10-28: 500 mL via INTRAVENOUS

## 2018-10-28 NOTE — ED Provider Notes (Signed)
[facility] Emergency Department Provider Note ____________________________________________   First MD Initiated Contact with Patient 10/28/18 1543     (approximate)  I have reviewed the triage vital signs and the nursing notes.   HISTORY  Chief Complaint Shortness of Breath and Chest Pain    HPI Kristin Stuart is a 67 y.o. female with PMH as noted below who presents with chest and neck pain and shortness of breath, acute onset a few hours ago and not relieved by Tylenol 3 that she takes for her neck pain.  The patient states that she has right-sided neck pain and spasms due to chronic problems in her right shoulder and she has had pain of this intensity before, but states the pain normally does not last this long and is normally improved with her Tylenol 3.  She states that she feels tight in her chest and short of breath.  She reports that she has anterior chest pain when she takes a deep breath.  She denies cough or fever.  She has no vomiting or other GI symptoms.   Past Medical History:  Diagnosis Date  . Anemia   . Arthritis   . Edema    LEGS/FEET  . Edema    legs/feet  . Fibromyalgia   . GERD (gastroesophageal reflux disease)   . History of hiatal hernia   . Hypercholesteremia   . Hypertension   . Hypothyroid   . PONV (postoperative nausea and vomiting)   . Post laminectomy syndrome   . Stroke (HCC)    tia  . TIA (transient ischemic attack) 2012    Patient Active Problem List   Diagnosis Date Noted  . Bilateral occipital neuralgia 03/14/2015  . DDD (degenerative disc disease), lumbar 03/14/2015  . Facet syndrome, lumbar 03/14/2015  . DDD (degenerative disc disease), cervical 03/14/2015  . Cervical facet joint syndrome 03/14/2015  . Migraine 03/14/2015    Past Surgical History:  Procedure Laterality Date  . ABDOMINAL HYSTERECTOMY  1975  . ANTERIOR CERVICAL DECOMP/DISCECTOMY FUSION  1993, 2011   C5-C6, C3-C4  . BACK SURGERY     x4  . BLADDER SUSPENSION  2010  . BREAST CYST EXCISION Right 1981   neg  . BREAST EXCISIONAL BIOPSY Left 1980   NEG  . BREAST LUMPECTOMY Left 1980  . CATARACT EXTRACTION W/PHACO Right 06/25/2015   Procedure: CATARACT EXTRACTION PHACO AND INTRAOCULAR LENS PLACEMENT (IOC);  Surgeon: Galen Manila, MD;  Location: [facility] ORS;  Service: Ophthalmology;  Laterality: Right;  US:00:54.9 AP:25.9 CDE:14.22 LOT PAK #[id number] H  . CATARACT EXTRACTION W/PHACO Left 07/16/2015   Procedure: CATARACT EXTRACTION PHACO AND INTRAOCULAR LENS PLACEMENT (IOC);  Surgeon: Galen Manila, MD;  Location: [facility] ORS;  Service: Ophthalmology;  Laterality: Left;  [place]: 00:21.5 AP%: 20.1 CDE: 4.31 Lot # [id number]  . EXCISIONAL HEMORRHOIDECTOMY  2014  . LUMBAR DISC SURGERY  1999   L4-L5  . POLYPECTOMY  20014   3 polyps removed from large intestines  . TUBAL LIGATION  1974    Prior to Admission medications   Medication Sig Start Date End Date Taking? Authorizing Provider  acetaminophen-codeine (TYLENOL #3) 300-30 MG tablet Take 1 tablet by mouth 2 (two) times daily as needed for moderate pain.   Yes [provider]  aspirin 81 MG tablet Take 81 mg by mouth daily.   Yes [provider]  cyclobenzaprine (FLEXERIL) 10 MG tablet Take 10 mg by mouth at bedtime as needed for muscle spasms.   Yes [provider]  hydrochlorothiazide (HYDRODIURIL) 25 MG tablet Take 25 mg by mouth daily.   Yes [provider]  levothyroxine (SYNTHROID, LEVOTHROID) 112 MCG tablet Take 112 mcg by mouth See admin instructions. Take 1 tablet (112mcg) by mouth every Monday, Tuesday, Thursday, Friday, Saturday and Sunday morning   Yes [provider]  levothyroxine (SYNTHROID, LEVOTHROID) 125 MCG tablet Take 125 mcg by mouth every Wednesday.    Yes [provider]  meloxicam (MOBIC) 15 MG tablet Take 15 mg by mouth daily.   Yes [provider]  metoprolol succinate (TOPROL-XL) 100 MG 24 hr  tablet Take 100 mg by mouth daily. Take with or immediately following a meal.    Yes [provider]  Oxcarbazepine (TRILEPTAL) 300 MG tablet Take 300 mg by mouth 2 (two) times daily.   Yes [provider]  simvastatin (ZOCOR) 20 MG tablet Take 20 mg by mouth at bedtime. 03/23/16 10/28/18 Yes [provider]  albuterol (PROVENTIL HFA;VENTOLIN HFA) 108 (90 Base) MCG/ACT inhaler Inhale 2 puffs into the lungs every 4 (four) hours as needed for up to 5 days for wheezing or shortness of breath. 10/28/18 11/02/18  Dionne BucySiadecki, Chaseton Yepiz, MD  predniSONE (DELTASONE) 20 MG tablet Take 2 tablets (40 mg total) by mouth daily for 4 days. 10/29/18 11/02/18  Dionne BucySiadecki, Shiheem Corporan, MD    Allergies Penicillins and Norvasc [amlodipine]  Family History  Problem Relation Age of Onset  . Kidney disease Mother   . Varicose Veins Mother   . Cancer Father   . COPD Father   . Breast cancer Neg Hx     Social History Social History   Tobacco Use  . Smoking status: Former Smoker    Last attempt to quit: 03/13/1998    Years since quitting: 20.6  . Smokeless tobacco: Never Used  Substance Use Topics  . Alcohol use: Yes    Alcohol/week: 0.0 standard drinks    Comment: occasionally  . Drug use: No    Review of Systems  Constitutional: No fever. Eyes: No redness. ENT: Positive for neck pain. Cardiovascular: Positive for chest pain. Respiratory: Positive for shortness of breath. Gastrointestinal: No nausea or vomiting. Genitourinary: Negative for flank pain.  Musculoskeletal: Negative for back pain. Skin: Negative for rash. Neurological: Negative for headache.   ____________________________________________   PHYSICAL EXAM:  VITAL SIGNS: ED Triage Vitals  Enc Vitals Group     BP 10/28/18 1524 (!) 144/84     Pulse Rate 10/28/18 1524 76     Resp 10/28/18 1524 18     Temp 10/28/18 1524 (!) 97.5 F (36.4 C)     Temp Source 10/28/18 1524 Oral     SpO2 10/28/18 1524 100 %      Weight 10/28/18 1525 158 lb (71.7 kg)     Height 10/28/18 1525 5\' 5"  (1.651 m)     Head Circumference --      Peak Flow --      Pain Score 10/28/18 1525 6     Pain Loc --      Pain Edu? --      Excl. in GC? --     Constitutional: Alert and oriented.  Uncomfortable appearing but in no acute distress. Eyes: Conjunctivae are normal.  Head: Atraumatic. Nose: No congestion/rhinnorhea. Mouth/Throat: Mucous membranes are moist.   Neck: Pain on range of motion.  No tenderness to the neck.  No swelling or cutaneous findings. Cardiovascular: Normal rate, regular rhythm. Grossly normal heart sounds.  Good peripheral circulation. Respiratory: Normal respiratory  effort.  No retractions.  Scattered wheezing bilaterally. Gastrointestinal:  No distention.  Musculoskeletal: No lower extremity edema.  Extremities warm and well perfused.  No calf or popliteal swelling or tenderness. Neurologic:  Normal speech and language. No gross focal neurologic deficits are appreciated.  Skin:  Skin is warm and dry. No rash noted. Psychiatric: Mood and affect are normal. Speech and behavior are normal.  ____________________________________________   LABS (all labs ordered are listed, but only abnormal results are displayed)  Labs Reviewed  BASIC METABOLIC PANEL - Abnormal; Notable for the following components:      Result Value   Sodium 131 (*)    Chloride 95 (*)    Glucose, Bld 135 (*)    Creatinine, Ser 1.26 (*)    GFR calc non Af Amer 43 (*)    GFR calc Af Amer 50 (*)    All other components within normal limits  CBC - Abnormal; Notable for the following components:   RDW 11.1 (*)    All other components within normal limits  TROPONIN I  FIBRIN DERIVATIVES D-DIMER ([facility] ONLY)   ____________________________________________  EKG  ED ECG REPORT I, Dionne BucySebastian Zamyah Wiesman, the attending physician, personally viewed and interpreted this ECG.  Date: 10/28/2018 EKG Time: 1519 Rate: 78 Rhythm: normal  sinus rhythm QRS Axis: Left axis Intervals: normal ST/T Wave abnormalities: Nonspecific lateral T wave flattening Narrative Interpretation: no evidence of acute ischemia; no significant change when compared to EKG of 10/16/2017  ____________________________________________  RADIOLOGY  CXR: No focal infiltrate or other acute abnormality  ____________________________________________   PROCEDURES  Procedure(s) performed: No  Procedures  Critical Care performed: No ____________________________________________   INITIAL IMPRESSION / ASSESSMENT AND PLAN / ED COURSE  Pertinent labs & imaging results that were available during my care of the patient were reviewed by me and considered in my medical decision making (see chart for details).  67 year old female with PMH as noted above presents with right-sided neck pain which she has had chronically, as well as shortness of breath and pleuritic chest pain which started this afternoon.  She states at the time this pain started she became somewhat lightheaded and sweaty.  The patient states that she has the neck pain chronically related to problems with her right shoulder, but that it normally does not last this long.  On exam the patient is uncomfortable but not acutely ill-appearing.  She is slightly hypertensive but her other vital signs are normal.  She has some faint wheezing bilaterally but the remainder of the exam is unremarkable.  EKG is nonischemic and unchanged from prior.  The neck pain is chronic and she has had pain of similar intensity previously.  The chest pain, shortness of breath, and lightheadedness may be related to musculoskeletal pain radiating from the shoulder and neck, or possibly acute bronchitis given the presence of the wheezing.  It appears that when the pain started and was not getting better, the patient had somewhat of a vasovagal near syncope.  Although the pain is pleuritic I have a low suspicion for PE given  that she has no tachycardia or hypoxia.  I will obtain a d-dimer to rule this out.  I also do not suspect aortic dissection or other vascular etiology given that she has had the neck pain previously, and again with no risk factors, normal vital signs and overall well appearance as well as no radiation to the back or arm this would be very unlikely.  We will obtain basic labs and  a troponin as well as a chest x-ray.  I will give NSAID, muscle relaxant, albuterol, and reassess.  ----------------------------------------- 8:25 PM on 10/28/2018 -----------------------------------------  Chest x-ray showed no acute abnormalities.  Lab work-up is also unremarkable including negative troponin.  There is no indication for repeat troponin given the duration since symptom onset and the atypical nature of the pain.  The patient's d-dimer is negative.  There is no clinical evidence for PE.  Her vital signs have remained stable.  I discussed the results of the work-up with the patient.  I suspect most likely acute bronchitis and exacerbation of her chronic neck pain.  I have prescribed an albuterol inhaler and prednisone to use for the next several days.  Thorough return precautions given, and the patient expresses understanding.  ____________________________________________   FINAL CLINICAL IMPRESSION(S) / ED DIAGNOSES  Final diagnoses:  Atypical chest pain  Acute bronchitis, unspecified organism      NEW MEDICATIONS STARTED DURING THIS VISIT:  New Prescriptions   ALBUTEROL (PROVENTIL HFA;VENTOLIN HFA) 108 (90 BASE) MCG/ACT INHALER    Inhale 2 puffs into the lungs every 4 (four) hours as needed for up to 5 days for wheezing or shortness of breath.   PREDNISONE (DELTASONE) 20 MG TABLET    Take 2 tablets (40 mg total) by mouth daily for 4 days.     Note:  This document was prepared using Dragon voice recognition software and may include unintentional dictation errors.    Dionne Bucy,  MD 10/28/18 2026

## 2018-10-28 NOTE — ED Notes (Signed)
Reviewed discharge instructions, follow-up care, and prescriptions with patient. Patient verbalized understanding of all information reviewed. Patient stable, with no distress noted at this time.    

## 2018-10-28 NOTE — ED Triage Notes (Signed)
Pt arrived via POV with reports of shortness of breath and chest pain that started today after taking her medications, pt states about 5 minutes after taking medications she developed shortness of breath and chest pain.  Pt denies any recent cough, pt c/o dry mouth and hoarse voice due to dry mouth.

## 2018-10-28 NOTE — ED Notes (Signed)
Patient transported to X-ray 

## 2018-10-28 NOTE — Discharge Instructions (Addendum)
Use the albuterol inhaler every 4-6 hours for the next several days as needed for shortness of breath or chest tightness.  You should take the prednisone for 4 days starting tomorrow as well.  Take your other medications as prescribed.  Return to the ER for new, worsening, or persistent chest or neck pain, shortness of breath, chest tightness, feeling like you are going to pass out, or any other new or worsening symptoms that concern you.

## 2018-12-13 ENCOUNTER — Encounter
Admission: RE | Admit: 2018-12-13 | Discharge: 2018-12-13 | Disposition: A | Discharge status: Home / Self Care | Payer: Medicare Other | Source: Ambulatory Visit | Attending: Surgery | Admitting: Surgery

## 2018-12-13 ENCOUNTER — Other Ambulatory Visit: Payer: Self-pay

## 2018-12-13 DIAGNOSIS — Z01812 Encounter for preprocedural laboratory examination: Secondary | ICD-10-CM | POA: Insufficient documentation

## 2018-12-13 HISTORY — DX: Raynaud's syndrome without gangrene: I73.00

## 2018-12-13 LAB — CBC
HCT: 35.4 % — ABNORMAL LOW (ref 36.0–46.0)
Hemoglobin: 12 g/dL (ref 12.0–15.0)
MCH: 30.2 pg (ref 26.0–34.0)
MCHC: 33.9 g/dL (ref 30.0–36.0)
MCV: 88.9 fL (ref 80.0–100.0)
NRBC: 0 % (ref 0.0–0.2)
PLATELETS: 275 10*3/uL (ref 150–400)
RBC: 3.98 MIL/uL (ref 3.87–5.11)
RDW: 10.6 % — ABNORMAL LOW (ref 11.5–15.5)
WBC: 3.8 10*3/uL — ABNORMAL LOW (ref 4.0–10.5)

## 2018-12-13 LAB — BASIC METABOLIC PANEL
Anion gap: 5 (ref 5–15)
BUN: 17 mg/dL (ref 8–23)
CO2: 32 mmol/L (ref 22–32)
Calcium: 8.8 mg/dL — ABNORMAL LOW (ref 8.9–10.3)
Chloride: 98 mmol/L (ref 98–111)
Creatinine, Ser: 0.66 mg/dL (ref 0.44–1.00)
GFR calc Af Amer: >60 mL/min (ref >60)
GFR calc non Af Amer: >60 mL/min (ref >60)
Glucose, Bld: 85 mg/dL (ref 70–99)
Potassium: 3.7 mmol/L (ref 3.5–5.1)
Sodium: 135 mmol/L (ref 135–145)

## 2018-12-13 LAB — URINALYSIS, ROUTINE W REFLEX MICROSCOPIC
Bilirubin Urine: NEGATIVE
Glucose, UA: NEGATIVE mg/dL
Hgb urine dipstick: NEGATIVE
Ketones, ur: NEGATIVE mg/dL
Leukocytes,Ua: NEGATIVE
NITRITE: NEGATIVE
PROTEIN: NEGATIVE mg/dL
Specific Gravity, Urine: 1.009 (ref 1.005–1.030)
pH: 6 (ref 5.0–8.0)

## 2018-12-13 LAB — TYPE AND SCREEN
ABO/RH(D): O POS
Antibody Screen: NEGATIVE

## 2018-12-13 LAB — SURGICAL PCR SCREEN
MRSA, PCR: NEGATIVE
STAPHYLOCOCCUS AUREUS: POSITIVE — AB

## 2018-12-13 NOTE — Pre-Procedure Instructions (Signed)
Patient wants everyone to know that anytime she has a surgical incision she develops a hematoma at the incision site.

## 2018-12-13 NOTE — Patient Instructions (Signed)
Your procedure is scheduled on: Tuesday 12/20/2018 Report to [facility]. To find out your arrival time please call [phone] between 1PM - 3PM on Monday 12/19/2018.  Remember: Instructions that are not followed completely may result in serious medical risk, up to and including death, or upon the discretion of your surgeon and anesthesiologist your surgery may need to be rescheduled.     _X__ 1. Do not eat food after midnight the night before your procedure.                 No gum chewing or hard candies. You may drink clear liquids up to 2 hours                 before you are scheduled to arrive for your surgery- DO not drink clear                 liquids within 2 hours of the start of your surgery.                 Clear Liquids include:  water, apple juice without pulp, clear carbohydrate                 drink such as Clearfast or Gatorade, Black Coffee or Tea (Do not add                 anything to coffee or tea).  __X__2.  On the morning of surgery brush your teeth with toothpaste and water, you                 may rinse your mouth with mouthwash if you wish.  Do not swallow any              toothpaste of mouthwash.     _X__ 3.  No Alcohol for 24 hours before or after surgery.   _X__ 4.  Do Not Smoke or use e-cigarettes For 24 Hours Prior to Your Surgery.                 Do not use any chewable tobacco products for at least 6 hours prior to                 surgery.  ____  5.  Bring all medications with you on the day of surgery if instructed.   __X__  6.  Notify your doctor if there is any change in your medical condition      (cold, fever, infections).     Do not wear jewelry, make-up, hairpins, clips or nail polish. Do not wear lotions, powders, or perfumes.  Do not shave 48 hours prior to surgery. Men may shave face and neck. Do not bring valuables to the hospital.    [facility] is not responsible for any belongings or  valuables.  Contacts, dentures/partials or body piercings may not be worn into surgery. Bring a case for your contacts, glasses or hearing aids, a denture cup will be supplied. Leave your suitcase in the car. After surgery it may be brought to your room. For patients admitted to the hospital, discharge time is determined by your treatment team.   Patients discharged the day of surgery will not be allowed to drive home.   Please read over the following fact sheets that you were given:   MRSA Information  __X__ Take these medicines the morning of surgery with A SIP OF WATER:  1. amLODipine (NORVASC)   2. levothyroxine (SYNTHROID, LEVOTHROID)  3. metoprolol succinate (TOPROL-XL)   4. Oxcarbazepine (TRILEPTAL)  5.  6.  ____ Fleet Enema (as directed)   __X__ Use CHG Soap/SAGE wipes as directed  ____ Use inhalers on the day of surgery  ____ Stop metformin/Janumet/Farxiga 2 days prior to surgery    ____ Take 1/2 of usual insulin dose the night before surgery. No insulin the morning          of surgery.   ____ Stop Blood Thinners Coumadin/Plavix/Xarelto/Pleta/Pradaxa/Eliquis/Effient/Aspirin  on   Or contact your Surgeon, Cardiologist or Medical Doctor regarding  ability to stop your blood thinners  __X__ Stop Anti-inflammatories 7 days before surgery such as Advil, Ibuprofen, Motrin,  BC or Goodies Powder, Naprosyn, Naproxen, Aleve, Aspirin    __X__ Stop all herbal supplements, fish oil or vitamin E until after surgery.    ____ Bring C-Pap to the hospital.

## 2018-12-13 NOTE — Pre-Procedure Instructions (Signed)
Copied from ED note 10/2018   EKG  ED ECG REPORT I, Dionne Bucy, the attending physician, personally viewed and interpreted this ECG.  Date: 10/28/2018 EKG Time: 1519 Rate: 78 Rhythm: normal sinus rhythm QRS Axis: Left axis Intervals: normal ST/T Wave abnormalities: Nonspecific lateral T wave flattening Narrative Interpretation: no evidence of acute ischemia; no significant change when compared to EKG of 10/16/2017

## 2018-12-14 NOTE — Pre-Procedure Instructions (Signed)
Met B and CBC results sent to Dr. Roland Rack and Anesthesia for review.  Positive staph aureus results also sent to Dr. Roland Rack for review.  Asked if wanted any other antibiotic given?

## 2018-12-15 LAB — URINE CULTURE: Culture: >=100000 — AB

## 2018-12-20 ENCOUNTER — Inpatient Hospital Stay: Payer: Medicare Other | Admitting: Anesthesiology

## 2018-12-20 ENCOUNTER — Other Ambulatory Visit: Payer: Self-pay

## 2018-12-20 ENCOUNTER — Inpatient Hospital Stay
Admission: RE | Admit: 2018-12-20 | Discharge: 2018-12-22 | DRG: 483 | Disposition: A | Discharge status: Home Health | Payer: Medicare Other | Attending: Surgery | Admitting: Surgery

## 2018-12-20 ENCOUNTER — Encounter
Admission: RE | Disposition: A | Discharge status: Home Health | Payer: Self-pay | Source: Home / Self Care | Attending: Surgery

## 2018-12-20 ENCOUNTER — Inpatient Hospital Stay: Payer: Medicare Other

## 2018-12-20 DIAGNOSIS — M19011 Primary osteoarthritis, right shoulder: Secondary | ICD-10-CM | POA: Diagnosis present

## 2018-12-20 DIAGNOSIS — Z8673 Personal history of transient ischemic attack (TIA), and cerebral infarction without residual deficits: Secondary | ICD-10-CM

## 2018-12-20 DIAGNOSIS — I10 Essential (primary) hypertension: Secondary | ICD-10-CM | POA: Diagnosis present

## 2018-12-20 DIAGNOSIS — Z96611 Presence of right artificial shoulder joint: Secondary | ICD-10-CM

## 2018-12-20 DIAGNOSIS — Z23 Encounter for immunization: Secondary | ICD-10-CM | POA: Diagnosis not present

## 2018-12-20 DIAGNOSIS — E7800 Pure hypercholesterolemia, unspecified: Secondary | ICD-10-CM | POA: Diagnosis present

## 2018-12-20 DIAGNOSIS — K219 Gastro-esophageal reflux disease without esophagitis: Secondary | ICD-10-CM | POA: Diagnosis present

## 2018-12-20 DIAGNOSIS — K449 Diaphragmatic hernia without obstruction or gangrene: Secondary | ICD-10-CM | POA: Diagnosis present

## 2018-12-20 DIAGNOSIS — Z87891 Personal history of nicotine dependence: Secondary | ICD-10-CM

## 2018-12-20 DIAGNOSIS — E039 Hypothyroidism, unspecified: Secondary | ICD-10-CM | POA: Diagnosis present

## 2018-12-20 DIAGNOSIS — M797 Fibromyalgia: Secondary | ICD-10-CM | POA: Diagnosis present

## 2018-12-20 HISTORY — PX: REVERSE SHOULDER ARTHROPLASTY: SHX5054

## 2018-12-20 HISTORY — PX: JOINT REPLACEMENT: SHX530

## 2018-12-20 LAB — ABO/RH: ABO/RH(D): O POS

## 2018-12-20 SURGERY — ARTHROPLASTY, SHOULDER, TOTAL, REVERSE
Anesthesia: General | Laterality: Right

## 2018-12-20 SURGERY — ARTHROPLASTY, SHOULDER, TOTAL, REVERSE
Anesthesia: Choice | Laterality: Right

## 2018-12-20 MED ORDER — LEVOTHYROXINE SODIUM 112 MCG PO TABS
112.0000 ug | ORAL_TABLET | ORAL | Status: DC
  Administered 2018-12-20 – 2018-12-22 (×2): 112 ug via ORAL
  Filled 2018-12-20 (×3): qty 1

## 2018-12-20 MED ORDER — PHENYLEPHRINE HCL-NACL 10-0.9 MG/250ML-% IV SOLN
INTRAVENOUS | Status: DC | PRN
  Administered 2018-12-20: 30 ug/min via INTRAVENOUS

## 2018-12-20 MED ORDER — MIDAZOLAM HCL 2 MG/2ML IJ SOLN
1.0000 mg | Freq: Once | INTRAMUSCULAR | Status: AC
  Administered 2018-12-20: 1 mg via INTRAVENOUS

## 2018-12-20 MED ORDER — CEFAZOLIN SODIUM-DEXTROSE 1-4 GM/50ML-% IV SOLN
1.0000 g | Freq: Four times a day (QID) | INTRAVENOUS | Status: AC
  Administered 2018-12-20 – 2018-12-21 (×3): 1 g via INTRAVENOUS
  Filled 2018-12-20 (×3): qty 50

## 2018-12-20 MED ORDER — CYCLOBENZAPRINE HCL 10 MG PO TABS
10.0000 mg | ORAL_TABLET | Freq: Three times a day (TID) | ORAL | Status: DC | PRN
  Administered 2018-12-20 – 2018-12-22 (×3): 10 mg via ORAL
  Filled 2018-12-20 (×3): qty 1

## 2018-12-20 MED ORDER — OXCARBAZEPINE 300 MG PO TABS
300.0000 mg | ORAL_TABLET | Freq: Two times a day (BID) | ORAL | Status: DC
  Administered 2018-12-20 – 2018-12-22 (×4): 300 mg via ORAL
  Filled 2018-12-20 (×6): qty 1

## 2018-12-20 MED ORDER — ACETAMINOPHEN 10 MG/ML IV SOLN
INTRAVENOUS | Status: AC
  Filled 2018-12-20: qty 100

## 2018-12-20 MED ORDER — ONDANSETRON HCL 4 MG/2ML IJ SOLN: 4.0000 mg | Freq: Once | INTRAMUSCULAR | Status: DC | PRN

## 2018-12-20 MED ORDER — ACETAMINOPHEN 325 MG PO TABS
325.0000 mg | ORAL_TABLET | Freq: Four times a day (QID) | ORAL | Status: DC | PRN
  Administered 2018-12-21 – 2018-12-22 (×3): 650 mg via ORAL
  Filled 2018-12-20: qty 2
  Filled 2018-12-20: qty 1
  Filled 2018-12-20: qty 2

## 2018-12-20 MED ORDER — ONDANSETRON HCL 4 MG/2ML IJ SOLN: 4.0000 mg | Freq: Four times a day (QID) | INTRAMUSCULAR | Status: DC | PRN

## 2018-12-20 MED ORDER — FAMOTIDINE 20 MG PO TABS
20.0000 mg | ORAL_TABLET | Freq: Once | ORAL | Status: AC
  Administered 2018-12-20: 20 mg via ORAL

## 2018-12-20 MED ORDER — POLYVINYL ALCOHOL 1.4 % OP SOLN
1.0000 [drp] | Freq: Three times a day (TID) | OPHTHALMIC | Status: DC | PRN
  Filled 2018-12-20: qty 15

## 2018-12-20 MED ORDER — ROCURONIUM BROMIDE 100 MG/10ML IV SOLN
INTRAVENOUS | Status: DC | PRN
  Administered 2018-12-20: 10 mg via INTRAVENOUS
  Administered 2018-12-20: 20 mg via INTRAVENOUS
  Administered 2018-12-20: 70 mg via INTRAVENOUS

## 2018-12-20 MED ORDER — LEVOTHYROXINE SODIUM 25 MCG PO TABS
125.0000 ug | ORAL_TABLET | ORAL | Status: DC
  Administered 2018-12-21: 125 ug via ORAL
  Filled 2018-12-20: qty 1

## 2018-12-20 MED ORDER — PROPOFOL 10 MG/ML IV BOLUS
INTRAVENOUS | Status: DC | PRN
  Administered 2018-12-20: 130 mg via INTRAVENOUS
  Administered 2018-12-20: 30 mg via INTRAVENOUS

## 2018-12-20 MED ORDER — ONDANSETRON HCL 4 MG/2ML IJ SOLN
INTRAMUSCULAR | Status: DC | PRN
  Administered 2018-12-20: 4 mg via INTRAVENOUS

## 2018-12-20 MED ORDER — DIPHENHYDRAMINE HCL 50 MG/ML IJ SOLN
INTRAMUSCULAR | Status: AC
  Administered 2018-12-20: 25 mg via INTRAVENOUS
  Filled 2018-12-20: qty 1

## 2018-12-20 MED ORDER — ENOXAPARIN SODIUM 40 MG/0.4ML ~~LOC~~ SOLN
40.0000 mg | SUBCUTANEOUS | Status: DC
  Administered 2018-12-21 – 2018-12-22 (×2): 40 mg via SUBCUTANEOUS
  Filled 2018-12-20 (×2): qty 0.4

## 2018-12-20 MED ORDER — TRANEXAMIC ACID 1000 MG/10ML IV SOLN
INTRAVENOUS | Status: AC
  Filled 2018-12-20: qty 10

## 2018-12-20 MED ORDER — BUPIVACAINE-EPINEPHRINE (PF) 0.5% -1:200000 IJ SOLN
INTRAMUSCULAR | Status: DC | PRN
  Administered 2018-12-20: 30 mL

## 2018-12-20 MED ORDER — SIMVASTATIN 20 MG PO TABS
20.0000 mg | ORAL_TABLET | Freq: Every day | ORAL | Status: DC
  Administered 2018-12-20 – 2018-12-21 (×2): 20 mg via ORAL
  Filled 2018-12-20 (×2): qty 1

## 2018-12-20 MED ORDER — LIDOCAINE HCL (PF) 2 % IJ SOLN
INTRAMUSCULAR | Status: AC
  Filled 2018-12-20: qty 10

## 2018-12-20 MED ORDER — BUPIVACAINE LIPOSOME 1.3 % IJ SUSP
INTRAMUSCULAR | Status: AC
  Filled 2018-12-20: qty 20

## 2018-12-20 MED ORDER — TRAMADOL HCL 50 MG PO TABS
50.0000 mg | ORAL_TABLET | Freq: Four times a day (QID) | ORAL | Status: DC
  Administered 2018-12-20 – 2018-12-22 (×7): 50 mg via ORAL
  Filled 2018-12-20 (×7): qty 1

## 2018-12-20 MED ORDER — ACETAMINOPHEN 500 MG PO TABS
1000.0000 mg | ORAL_TABLET | Freq: Four times a day (QID) | ORAL | Status: AC
  Administered 2018-12-21 (×2): 1000 mg via ORAL
  Filled 2018-12-20 (×4): qty 2

## 2018-12-20 MED ORDER — DIPHENHYDRAMINE HCL 50 MG/ML IJ SOLN
25.0000 mg | Freq: Once | INTRAMUSCULAR | Status: AC
  Administered 2018-12-20: 25 mg via INTRAVENOUS

## 2018-12-20 MED ORDER — FENTANYL CITRATE (PF) 100 MCG/2ML IJ SOLN
INTRAMUSCULAR | Status: DC | PRN
  Administered 2018-12-20 (×4): 50 ug via INTRAVENOUS

## 2018-12-20 MED ORDER — METOPROLOL TARTRATE 50 MG PO TABS
ORAL_TABLET | ORAL | Status: AC
  Administered 2018-12-20: 50 mg via ORAL
  Filled 2018-12-20: qty 1

## 2018-12-20 MED ORDER — KETOROLAC TROMETHAMINE 30 MG/ML IJ SOLN
INTRAMUSCULAR | Status: AC
  Administered 2018-12-20: 30 mg via INTRAVENOUS
  Filled 2018-12-20: qty 1

## 2018-12-20 MED ORDER — ONDANSETRON HCL 4 MG/2ML IJ SOLN
INTRAMUSCULAR | Status: AC
  Filled 2018-12-20: qty 2

## 2018-12-20 MED ORDER — BUPIVACAINE HCL (PF) 0.5 % IJ SOLN
INTRAMUSCULAR | Status: DC | PRN
  Administered 2018-12-20: 10 mL

## 2018-12-20 MED ORDER — AMLODIPINE BESYLATE 5 MG PO TABS
2.5000 mg | ORAL_TABLET | Freq: Every day | ORAL | Status: DC
  Administered 2018-12-21 – 2018-12-22 (×2): 2.5 mg via ORAL
  Filled 2018-12-20 (×3): qty 1

## 2018-12-20 MED ORDER — SUGAMMADEX SODIUM 200 MG/2ML IV SOLN
INTRAVENOUS | Status: DC | PRN
  Administered 2018-12-20: 200 mg via INTRAVENOUS

## 2018-12-20 MED ORDER — VANCOMYCIN HCL IN DEXTROSE 1-5 GM/200ML-% IV SOLN
1000.0000 mg | Freq: Once | INTRAVENOUS | Status: AC
  Administered 2018-12-20: 1000 mg via INTRAVENOUS

## 2018-12-20 MED ORDER — FENTANYL CITRATE (PF) 100 MCG/2ML IJ SOLN
INTRAMUSCULAR | Status: AC
  Administered 2018-12-20: 25 ug via INTRAVENOUS
  Filled 2018-12-20: qty 2

## 2018-12-20 MED ORDER — METOPROLOL TARTRATE 50 MG PO TABS
50.0000 mg | ORAL_TABLET | Freq: Once | ORAL | Status: AC
  Administered 2018-12-20: 50 mg via ORAL

## 2018-12-20 MED ORDER — FLEET ENEMA 7-19 GM/118ML RE ENEM: 1.0000 | ENEMA | Freq: Once | RECTAL | Status: DC | PRN

## 2018-12-20 MED ORDER — TRANEXAMIC ACID 1000 MG/10ML IV SOLN
INTRAVENOUS | Status: DC | PRN
  Administered 2018-12-20: 1000 mg

## 2018-12-20 MED ORDER — BUPIVACAINE HCL (PF) 0.5 % IJ SOLN
INTRAMUSCULAR | Status: AC
  Filled 2018-12-20: qty 10

## 2018-12-20 MED ORDER — FENTANYL CITRATE (PF) 100 MCG/2ML IJ SOLN
25.0000 ug | INTRAMUSCULAR | Status: DC | PRN
  Administered 2018-12-20 (×4): 25 ug via INTRAVENOUS

## 2018-12-20 MED ORDER — DEXAMETHASONE SODIUM PHOSPHATE 4 MG/ML IJ SOLN
INTRAMUSCULAR | Status: DC | PRN
  Administered 2018-12-20: 5 mg via INTRAVENOUS

## 2018-12-20 MED ORDER — HYDROMORPHONE HCL 1 MG/ML IJ SOLN
0.2500 mg | INTRAMUSCULAR | Status: DC | PRN
  Administered 2018-12-20: 0.25 mg via INTRAVENOUS
  Administered 2018-12-21: 0.5 mg via INTRAVENOUS
  Filled 2018-12-20 (×2): qty 1

## 2018-12-20 MED ORDER — BUPIVACAINE LIPOSOME 1.3 % IJ SUSP
INTRAMUSCULAR | Status: DC | PRN
  Administered 2018-12-20: 20 mL

## 2018-12-20 MED ORDER — FENTANYL CITRATE (PF) 100 MCG/2ML IJ SOLN
INTRAMUSCULAR | Status: AC
  Administered 2018-12-20: 50 ug via INTRAVENOUS
  Filled 2018-12-20: qty 2

## 2018-12-20 MED ORDER — KETOROLAC TROMETHAMINE 30 MG/ML IJ SOLN
30.0000 mg | Freq: Once | INTRAMUSCULAR | Status: AC
  Administered 2018-12-20: 30 mg via INTRAVENOUS

## 2018-12-20 MED ORDER — LACTATED RINGERS IV SOLN
INTRAVENOUS | Status: DC
  Administered 2018-12-20 (×2): via INTRAVENOUS

## 2018-12-20 MED ORDER — SODIUM CHLORIDE 0.9 % IV SOLN
INTRAVENOUS | Status: DC
  Administered 2018-12-20 – 2018-12-21 (×2): via INTRAVENOUS

## 2018-12-20 MED ORDER — DIPHENHYDRAMINE HCL 12.5 MG/5ML PO ELIX: 12.5000 mg | ORAL_SOLUTION | ORAL | Status: DC | PRN

## 2018-12-20 MED ORDER — MENTHOL 3 MG MT LOZG
1.0000 | LOZENGE | OROMUCOSAL | Status: DC | PRN
  Administered 2018-12-20: 3 mg via ORAL
  Filled 2018-12-20: qty 9

## 2018-12-20 MED ORDER — SUCCINYLCHOLINE CHLORIDE 20 MG/ML IJ SOLN
INTRAMUSCULAR | Status: DC | PRN
  Administered 2018-12-20: 100 mg via INTRAVENOUS

## 2018-12-20 MED ORDER — ACETAMINOPHEN 10 MG/ML IV SOLN
INTRAVENOUS | Status: DC | PRN
  Administered 2018-12-20: 1000 mg via INTRAVENOUS

## 2018-12-20 MED ORDER — BISACODYL 10 MG RE SUPP: 10.0000 mg | Freq: Every day | RECTAL | Status: DC | PRN

## 2018-12-20 MED ORDER — DEXAMETHASONE SODIUM PHOSPHATE 10 MG/ML IJ SOLN
INTRAMUSCULAR | Status: AC
  Filled 2018-12-20: qty 1

## 2018-12-20 MED ORDER — VANCOMYCIN HCL IN DEXTROSE 1-5 GM/200ML-% IV SOLN
INTRAVENOUS | Status: AC
  Administered 2018-12-20: 1000 mg via INTRAVENOUS
  Filled 2018-12-20: qty 200

## 2018-12-20 MED ORDER — BUPIVACAINE HCL (PF) 0.5 % IJ SOLN
INTRAMUSCULAR | Status: AC
  Filled 2018-12-20: qty 30

## 2018-12-20 MED ORDER — METOPROLOL SUCCINATE ER 50 MG PO TB24
50.0000 mg | ORAL_TABLET | Freq: Every day | ORAL | Status: DC
  Administered 2018-12-21 – 2018-12-22 (×2): 50 mg via ORAL
  Filled 2018-12-20 (×3): qty 1

## 2018-12-20 MED ORDER — METOCLOPRAMIDE HCL 5 MG/ML IJ SOLN: 5.0000 mg | Freq: Three times a day (TID) | INTRAMUSCULAR | Status: DC | PRN

## 2018-12-20 MED ORDER — MIDAZOLAM HCL 2 MG/2ML IJ SOLN
INTRAMUSCULAR | Status: AC
  Administered 2018-12-20: 1 mg via INTRAVENOUS
  Filled 2018-12-20: qty 2

## 2018-12-20 MED ORDER — ONDANSETRON HCL 4 MG PO TABS: 4.0000 mg | ORAL_TABLET | Freq: Four times a day (QID) | ORAL | Status: DC | PRN

## 2018-12-20 MED ORDER — METOCLOPRAMIDE HCL 10 MG PO TABS: 5.0000 mg | ORAL_TABLET | Freq: Three times a day (TID) | ORAL | Status: DC | PRN

## 2018-12-20 MED ORDER — SODIUM CHLORIDE (PF) 0.9 % IJ SOLN
INTRAMUSCULAR | Status: AC
  Filled 2018-12-20: qty 50

## 2018-12-20 MED ORDER — OXYCODONE HCL 5 MG PO TABS
5.0000 mg | ORAL_TABLET | ORAL | Status: DC | PRN
  Administered 2018-12-20: 5 mg via ORAL
  Administered 2018-12-21: 10 mg via ORAL
  Administered 2018-12-21: 5 mg via ORAL
  Administered 2018-12-21 – 2018-12-22 (×6): 10 mg via ORAL
  Filled 2018-12-20: qty 2
  Filled 2018-12-20: qty 1
  Filled 2018-12-20: qty 2
  Filled 2018-12-20: qty 1
  Filled 2018-12-20 (×6): qty 2

## 2018-12-20 MED ORDER — ASPIRIN EC 81 MG PO TBEC
81.0000 mg | DELAYED_RELEASE_TABLET | Freq: Every day | ORAL | Status: DC
  Administered 2018-12-20 – 2018-12-22 (×3): 81 mg via ORAL
  Filled 2018-12-20 (×3): qty 1

## 2018-12-20 MED ORDER — HYDROCHLOROTHIAZIDE 25 MG PO TABS
25.0000 mg | ORAL_TABLET | Freq: Every day | ORAL | Status: DC
  Administered 2018-12-21 – 2018-12-22 (×2): 25 mg via ORAL
  Filled 2018-12-20 (×3): qty 1

## 2018-12-20 MED ORDER — KETOROLAC TROMETHAMINE 15 MG/ML IJ SOLN
15.0000 mg | Freq: Four times a day (QID) | INTRAMUSCULAR | Status: AC
  Administered 2018-12-20 – 2018-12-21 (×3): 15 mg via INTRAVENOUS
  Filled 2018-12-20 (×2): qty 1

## 2018-12-20 MED ORDER — LIDOCAINE HCL (PF) 1 % IJ SOLN
INTRAMUSCULAR | Status: AC
  Filled 2018-12-20: qty 5

## 2018-12-20 MED ORDER — ROCURONIUM BROMIDE 50 MG/5ML IV SOLN
INTRAVENOUS | Status: AC
  Filled 2018-12-20: qty 2

## 2018-12-20 MED ORDER — FAMOTIDINE 20 MG PO TABS
ORAL_TABLET | ORAL | Status: AC
  Filled 2018-12-20: qty 1

## 2018-12-20 MED ORDER — GLYCOPYRROLATE 0.2 MG/ML IJ SOLN
INTRAMUSCULAR | Status: AC
  Filled 2018-12-20: qty 1

## 2018-12-20 MED ORDER — PNEUMOCOCCAL VAC POLYVALENT 25 MCG/0.5ML IJ INJ
0.5000 mL | INJECTION | INTRAMUSCULAR | Status: AC
  Administered 2018-12-21: 0.5 mL via INTRAMUSCULAR
  Filled 2018-12-20: qty 0.5

## 2018-12-20 MED ORDER — EPINEPHRINE 30 MG/30ML IJ SOLN
INTRAMUSCULAR | Status: AC
  Filled 2018-12-20: qty 1

## 2018-12-20 MED ORDER — FENTANYL CITRATE (PF) 100 MCG/2ML IJ SOLN
50.0000 ug | Freq: Once | INTRAMUSCULAR | Status: AC
  Administered 2018-12-20: 50 ug via INTRAVENOUS

## 2018-12-20 MED ORDER — PROPOFOL 10 MG/ML IV BOLUS
INTRAVENOUS | Status: AC
  Filled 2018-12-20: qty 20

## 2018-12-20 MED ORDER — DOCUSATE SODIUM 100 MG PO CAPS
100.0000 mg | ORAL_CAPSULE | Freq: Two times a day (BID) | ORAL | Status: DC
  Administered 2018-12-20 – 2018-12-21 (×3): 100 mg via ORAL
  Filled 2018-12-20 (×4): qty 1

## 2018-12-20 MED ORDER — LIDOCAINE HCL (CARDIAC) PF 100 MG/5ML IV SOSY
PREFILLED_SYRINGE | INTRAVENOUS | Status: DC | PRN
  Administered 2018-12-20 (×2): 100 mg via INTRAVENOUS

## 2018-12-20 MED ORDER — VANCOMYCIN HCL IN DEXTROSE 1-5 GM/200ML-% IV SOLN
1000.0000 mg | Freq: Two times a day (BID) | INTRAVENOUS | Status: AC
  Filled 2018-12-20: qty 200

## 2018-12-20 MED ORDER — MAGNESIUM HYDROXIDE 400 MG/5ML PO SUSP: 30.0000 mL | Freq: Every day | ORAL | Status: DC | PRN

## 2018-12-20 SURGICAL SUPPLY — 65 items
BASEPLATE GLENOSPHERE 25 (Plate) ×2 IMPLANT
BASEPLATE GLENOSPHERE 25MM (Plate) ×1 IMPLANT
BEARING HUMERAL SHLDER 36M STD (Shoulder) ×1 IMPLANT
BIT DRILL TWIST 2.7 (BIT) ×2 IMPLANT
BIT DRILL TWIST 2.7MM (BIT) ×1
BLADE SAGITTAL WIDE XTHICK NO (BLADE) ×3 IMPLANT
CANISTER SUCT 1200ML W/VALVE (MISCELLANEOUS) ×3 IMPLANT
CANISTER SUCT 3000ML PPV (MISCELLANEOUS) ×6 IMPLANT
CHLORAPREP W/TINT 26ML (MISCELLANEOUS) ×3 IMPLANT
COOLER POLAR GLACIER W/PUMP (MISCELLANEOUS) ×3 IMPLANT
COVER WAND RF STERILE (DRAPES) IMPLANT
CRADLE LAMINECT ARM (MISCELLANEOUS) ×3 IMPLANT
DRAPE IMP U-DRAPE 54X76 (DRAPES) ×6 IMPLANT
DRAPE INCISE IOBAN 66X45 STRL (DRAPES) ×6 IMPLANT
DRAPE SHEET LG 3/4 BI-LAMINATE (DRAPES) ×6 IMPLANT
DRAPE TABLE BACK 80X90 (DRAPES) ×3 IMPLANT
DRSG OPSITE POSTOP 4X8 (GAUZE/BANDAGES/DRESSINGS) ×3 IMPLANT
ELECT BLADE 6.5 EXT (BLADE) IMPLANT
ELECT CAUTERY BLADE 6.4 (BLADE) ×3 IMPLANT
GLENOID SPHERE STD STRL 36MM (Orthopedic Implant) ×3 IMPLANT
GLOVE BIO SURGEON STRL SZ7.5 (GLOVE) ×12 IMPLANT
GLOVE BIO SURGEON STRL SZ8 (GLOVE) ×12 IMPLANT
GLOVE BIOGEL PI IND STRL 8 (GLOVE) ×1 IMPLANT
GLOVE BIOGEL PI INDICATOR 8 (GLOVE) ×2
GLOVE INDICATOR 8.0 STRL GRN (GLOVE) ×3 IMPLANT
GOWN STRL REUS W/ TWL LRG LVL3 (GOWN DISPOSABLE) ×1 IMPLANT
GOWN STRL REUS W/ TWL XL LVL3 (GOWN DISPOSABLE) ×1 IMPLANT
GOWN STRL REUS W/TWL LRG LVL3 (GOWN DISPOSABLE) ×2
GOWN STRL REUS W/TWL XL LVL3 (GOWN DISPOSABLE) ×2
HOOD PEEL AWAY FLYTE STAYCOOL (MISCELLANEOUS) ×9 IMPLANT
KIT STABILIZATION SHOULDER (MISCELLANEOUS) ×3 IMPLANT
KIT TURNOVER KIT A (KITS) ×3 IMPLANT
MASK FACE SPIDER DISP (MASK) ×3 IMPLANT
MAT ABSORB  FLUID 56X50 GRAY (MISCELLANEOUS) ×2
MAT ABSORB FLUID 56X50 GRAY (MISCELLANEOUS) ×1 IMPLANT
NDL SAFETY ECLIPSE 18X1.5 (NEEDLE) ×1 IMPLANT
NEEDLE HYPO 18GX1.5 SHARP (NEEDLE) ×2
NEEDLE HYPO 22GX1.5 SAFETY (NEEDLE) ×3 IMPLANT
NEEDLE SPNL 20GX3.5 QUINCKE YW (NEEDLE) ×3 IMPLANT
NS IRRIG 500ML POUR BTL (IV SOLUTION) ×3 IMPLANT
PACK ARTHROSCOPY SHOULDER (MISCELLANEOUS) ×3 IMPLANT
PAD WRAPON POLAR SHDR UNIV (MISCELLANEOUS) ×1 IMPLANT
PIN HUMERAL STMN 3.2MMX9IN (INSTRUMENTS) ×3 IMPLANT
PULSAVAC PLUS IRRIG FAN TIP (DISPOSABLE) ×3
SCREW BONE CORT 6.5X35MM (Screw) ×3 IMPLANT
SCREW LOCKING 4.75MMX15MM (Screw) ×9 IMPLANT
SHOULDER HUMERAL BEAR 36M STD (Shoulder) ×3 IMPLANT
SLING ULTRA II M (MISCELLANEOUS) ×3 IMPLANT
SOL .9 NS 3000ML IRR  AL (IV SOLUTION) ×2
SOL .9 NS 3000ML IRR UROMATIC (IV SOLUTION) ×1 IMPLANT
SPONGE LAP 18X18 RF (DISPOSABLE) ×6 IMPLANT
STAPLER SKIN PROX 35W (STAPLE) ×3 IMPLANT
STEM HUMERAL STRL 11MMX55MM (Stem) ×3 IMPLANT
SUT ETHIBOND 0 MO6 C/R (SUTURE) ×3 IMPLANT
SUT FIBERWIRE #2 38 BLUE 1/2 (SUTURE) ×12
SUT VIC AB 0 CT1 36 (SUTURE) ×3 IMPLANT
SUT VIC AB 2-0 CT1 27 (SUTURE) ×4
SUT VIC AB 2-0 CT1 TAPERPNT 27 (SUTURE) ×2 IMPLANT
SUTURE FIBERWR #2 38 BLUE 1/2 (SUTURE) ×4 IMPLANT
SYR 10ML LL (SYRINGE) ×3 IMPLANT
SYR 30ML LL (SYRINGE) IMPLANT
TIP FAN IRRIG PULSAVAC PLUS (DISPOSABLE) ×1 IMPLANT
TRAY FOLEY MTR SLVR 16FR STAT (SET/KITS/TRAYS/PACK) ×3 IMPLANT
TRAY HUM REV SHOULDER STD +6 (Shoulder) ×3 IMPLANT
WRAPON POLAR PAD SHDR UNIV (MISCELLANEOUS) ×3

## 2018-12-20 NOTE — Anesthesia Postprocedure Evaluation (Addendum)
Anesthesia Post Note  Patient: Kristin Stuart  Procedure(s) Performed: REVERSE SHOULDER ARTHROPLASTY (Right )  Patient location during evaluation: PACU Anesthesia Type: General Level of consciousness: awake and alert and oriented Vital Signs Assessment: post-procedure vital signs reviewed and stable Respiratory status: spontaneous breathing Cardiovascular status: blood pressure returned to baseline Anesthetic complications: no Comments: Patient had stable VS in PACU, but initially complained of some mid upper arm discomfort post surgery which was improved with medication.  Right hand moved well , but fingers with some tingling.     Last Vitals:  Vitals:   12/20/18 1404 12/20/18 1419  BP: 122/68 115/61  Pulse: 71 69  Resp: 10 13  Temp:    SpO2: 97% 97%    Last Pain:  Vitals:   12/20/18 1419  TempSrc:   PainSc: Asleep        Patient required admission for post op pain control of around mid upper arm, even though shoulder was numb.  Incomplete block.          Charish Schroepfer

## 2018-12-20 NOTE — Anesthesia Procedure Notes (Addendum)
Anesthesia Regional Block: Interscalene brachial plexus block   Pre-Anesthetic Checklist: ,, timeout performed, Correct Patient, Correct Site, Correct Laterality, Correct Procedure, Correct Position, site marked, Risks and benefits discussed,  Surgical consent,  Pre-op evaluation,  At surgeon's request and post-op pain management  Laterality: Right  Prep: Maximum Sterile Barrier Precautions used, chloraprep, alcohol swabs       Needles:  Injection technique: Single-shot  Needle Type: Stimiplex     Needle Length: 5cm  Needle Gauge: 22     Additional Needles:   Procedures:, nerve stimulator,,, ultrasound used (permanent image in chart),,,,   Nerve Stimulator or Paresthesia:  Response: biceps flexion, 0.6 mA,   Additional Responses:   Narrative:  Start time: 12/20/2018 9:45 AM End time: 12/20/2018 9:55 AM Injection made incrementally with aspirations every 5 mL.  Performed by: Personally   Additional Notes: Time out called.  Patient placed in semisitting position with a roll placed under the right shoulder.  A scout [place] image was done to locate and mark the area of the block.  The right neck was prepped in sterile fashion.  A skin wheal was made lateral to the US probe with 1% Lidocaine plain.  The stimuplex needle was guided to the target site and the nerve twitch was obtained with a fade at around 0.52mAmps.  Easy incremental injection of 20 cc of Exparel and 10 cc of 0.5% Marcine plain.  No pain on injection with good spread.  The patient tolerated the procedure well.  Injection was easy with no resistance.

## 2018-12-20 NOTE — Transfer of Care (Signed)
Immediate Anesthesia Transfer of Care Note  Patient: Kristin Stuart  Procedure(s) Performed: REVERSE SHOULDER ARTHROPLASTY (Right )  Patient Location: PACU  Anesthesia Type:General  Level of Consciousness: awake and alert   Airway & Oxygen Therapy: Patient Spontanous Breathing and Patient connected to nasal cannula oxygen  Post-op Assessment: Report given to RN and Post -op Vital signs reviewed and stable  Post vital signs: Reviewed and stable  Last Vitals:  Vitals Value Taken Time  BP    Temp    Pulse    Resp 12 12/20/2018  1:17 PM  SpO2    Vitals shown include unvalidated device data.  Last Pain:  Vitals:   12/20/18 0831  TempSrc: Temporal         Complications: No apparent anesthesia complications

## 2018-12-20 NOTE — Evaluation (Signed)
Occupational Therapy Evaluation Patient Details Name: Kristin Stuart MRN: [id number] DOB: [birth year] Today's Date: 12/20/2018    History of Present Illness 67yo female s/p R reverse TSA on 12/20/2018. PMHx includes HTN, OA, fibromyalgia, Raynauds, hiatal hernia, GERD, hypothyroidism, TIA (2012).   Clinical Impression   Patient was seen for an OT evaluation this date. Pt lives by herself and reports having no family, friends, or neighbors who can assist her when she goes home. Pt reports plan to have groceries ordered online and delivered to her by taxi. Pt reports she is "very resourceful" and anticipates she will be able to return home and manage without much assist. Prior to surgery, pt was active and independent, working part time. Pt has orders for RUE to be immobilized and will be NWBing per MD. Patient presents with impaired strength/ROM, significant RUE pain, and sensation to R thumb (improved significantly with adjustment of thumb strap on sling). These impairments result in a decreased ability to perform self care tasks requiring mod assist for UB/LB dressing and bathing and max assist for application of polar care, compression stockings, and sling/immobilizer. Pt instructed in polar care mgt and sling mgt, proper positioning of RUE for sleep. Pt would benefit from skilled OT services to address polar care/sling mgt again, compression stockings mgt, sling/immobilizer mgt, ROM exercises for RUE, RUE precautions, adaptive strategies for bathing/dressing/toileting/grooming, positioning and considerations for sleep, and home/routines modifications to maximize falls prevention, safety, and independence. Handout provided. Pt able to perform bed mobility with supervision and OT adjusted polar care to improve comfort, optimize positioning, and to maximize skin integrity/safety. Pt will benefit from skilled OT services to address these limitations and improve independence in daily tasks. Will plan to see  for OT treatment next date. Recommend HHOT services to continue therapy to maximize return to PLOF, address home/routines modifications and safety, minimize falls risk, and minimize caregiver burden. Due to limited assist available upon return home, also recommend [facility] A[facility].    Follow Up Recommendations  Home health OT;Other (comment)(HH Aide, HHPT)    Equipment Recommendations  3 in 1 bedside commode    Recommendations for Other Services       Precautions / Restrictions Precautions Precautions: Shoulder Shoulder Interventions: Shoulder sling/immobilizer;At all times;Off for dressing/bathing/exercises Precaution Booklet Issued: Yes (comment) Precaution Comments: will review extensively tomorrow, pt a bit groggy post-op and unable to retain information Restrictions Weight Bearing Restrictions: Yes RUE Weight Bearing: Non weight bearing      Mobility Bed Mobility Overal bed mobility: Needs Assistance Bed Mobility: Supine to Sit;Sit to Sidelying;Rolling Rolling: Supervision   Supine to sit: Supervision;HOB elevated   Sit to sidelying: Supervision General bed mobility comments: increased time, pain limited, no assist required  Transfers Overall transfer level: Needs assistance Equipment used: 1 person hand held assist Transfers: Sit to/from Stand Sit to Stand: Min guard              Balance Overall balance assessment: Needs assistance Sitting-balance support: Feet supported;No upper extremity supported Sitting balance-Leahy Scale: Good     Standing balance support: Single extremity supported Standing balance-Leahy Scale: Poor                             ADL either performed or assessed with clinical judgement   ADL Overall ADL's : Needs assistance/impaired Eating/Feeding: NPO   Grooming: Sitting;Set up;Supervision/safety;Minimal assistance Grooming Details (indicate cue type and reason): Min A for R underarm grooming Upper Body Bathing: Minimal  assistance;Sitting;Moderate assistance   Lower Body Bathing: Sit to/from stand;Minimal assistance;Moderate assistance   Upper Body Dressing : Sitting;Moderate assistance   Lower Body Dressing: Sit to/from stand;Minimal assistance;Moderate assistance                       Vision Baseline Vision/History: Wears glasses Wears Glasses: Reading only Patient Visual Report: No change from baseline       Perception     Praxis      Pertinent Vitals/Pain Pain Assessment: 0-10 Pain Score: 8  Pain Location: R shoulder Pain Descriptors / Indicators: Aching;Sharp;Sore;Grimacing;Guarding Pain Intervention(s): Limited activity within patient's tolerance;Monitored during session;Premedicated before session;Repositioned;Ice applied     Hand Dominance Right   Extremity/Trunk Assessment Upper Extremity Assessment Upper Extremity Assessment: RUE deficits/detail(LUE WFL) RUE Deficits / Details: pt reported R thumb numb/tingling, OT adjusted thumb strap on sling and pt reported feeling better with impaired sensation resolving; good grip strength RUE: Unable to fully assess due to pain;Unable to fully assess due to immobilization RUE Sensation: decreased light touch RUE Coordination: decreased gross motor   Lower Extremity Assessment Lower Extremity Assessment: Defer to PT evaluation;Overall [facility] for tasks assessed   Cervical / Trunk Assessment Cervical / Trunk Assessment: Normal;Other exceptions Cervical / Trunk Exceptions: hx cervical surgeries   Communication Communication Communication: No difficulties   Cognition Arousal/Alertness: Awake/alert Behavior During Therapy: WFL for tasks assessed/performed Overall Cognitive Status: Within Functional Limits for tasks assessed                                 General Comments: overall oriented and able to follow commands, limited recall of new information POD0 from surgery, anticipate pt will be better able to retain new  edu/training next date   General Comments  polar care adjusted to maximize skin coverage and protection with pt reporting improved pain control as a result    Exercises Other Exercises Other Exercises: pt instructed in polar care mgt and sling mgt, will go over next date in detail as well to ensure recall and carryover Other Exercises: pt instructed in RUE positioning for sleep with pillows optimally positioned   Shoulder Instructions      Home Living Family/patient expects to be discharged to:: Private residence Living Arrangements: Alone Available Help at Discharge: (pt reports no one able to help) Type of Home: Other(Comment)(condo) Home Access: Level entry     Home Layout: Two level;1/2 bath on main level;Bed/bath upstairs Alternate Level Stairs-Number of Steps: pt reports able to sleep on couch on main floor if needed   Bathroom Shower/Tub: Chief Strategy OfficerTub/shower unit   Bathroom Toilet: Standard     Home Equipment: None          Prior Functioning/Environment Level of Independence: Independent        Comments: Pt independent, no falls        OT Problem List: Decreased strength;Decreased range of motion;Decreased knowledge of precautions;Decreased knowledge of use of DME or AE;Impaired UE functional use;Impaired balance (sitting and/or standing);Impaired sensation;Pain      OT Treatment/Interventions: Self-care/ADL training;Balance training;Therapeutic exercise;Therapeutic activities;DME and/or AE instruction;Patient/family education    OT Goals(Current goals can be found in the care plan section) Acute Rehab OT Goals Patient Stated Goal: have less pain OT Goal Formulation: With patient Time For Goal Achievement: 01/03/19 Potential to Achieve Goals: Good ADL Goals Pt Will Perform Grooming: with modified independence;sitting Pt Will Perform Upper Body Dressing: sitting;with set-up Pt  Will Perform Lower Body Dressing: sit to/from stand;with modified independence Pt Will  Transfer to Toilet: with supervision;ambulating(LRAD for amb, elevated commode if needed) Additional ADL Goal #1: Pt will independently verbalize polar care mgt includling wear schedule, donning/doffing, and positioning in order to be able to perform at home. Additional ADL Goal #2: Pt will independently verbalize shoulder sling/immobilizer mgt includling wear schedule, donning/doffing, and positioning in order to be able to perform at home.  OT Frequency: Min 2X/week   Barriers to D/C: Decreased caregiver support          Co-evaluation              AM-PAC OT "6 Clicks" Daily Activity     Outcome Measure Help from another person eating meals?: None Help from another person taking care of personal grooming?: A Little Help from another person toileting, which includes using toliet, bedpan, or urinal?: A Little Help from another person bathing (including washing, rinsing, drying)?: A Lot Help from another person to put on and taking off regular upper body clothing?: A Lot Help from another person to put on and taking off regular lower body clothing?: A Lot 6 Click Score: 16   End of Session    Activity Tolerance: Patient tolerated treatment well Patient left: in bed;with call bell/phone within reach;with bed alarm set;Other (comment)(shoulder sling/immobilizer, polar care in place)  OT Visit Diagnosis: Other abnormalities of gait and mobility (R26.89);Muscle weakness (generalized) (M62.81);Pain Pain - Right/Left: Right Pain - part of body: Shoulder                Time: 2353-6144 OT Time Calculation (min): 32 min Charges:  OT General Charges $OT Visit: 1 Visit OT Evaluation $OT Eval Low Complexity: 1 Low OT Treatments $Self Care/Home Management : 8-22 mins  Richrd Prime, MPH, MS, OTR/L ascom [phone] 12/20/18, 4:43 PM

## 2018-12-20 NOTE — H&P (Signed)
Paper H&P to be scanned into permanent record. H&P reviewed and patient re-examined. No changes. 

## 2018-12-20 NOTE — Progress Notes (Signed)
PT Cancellation Note  Patient Details Name: Kristin Stuart MRN: [id number] DOB: Jan 20, 1951   Cancelled Treatment:    Reason Eval/Treat Not Completed: Pain limiting ability to participate.  PT consult received.  Chart reviewed.  Nurse reports pt recently came to floor and was in a lot of pain but cleared PT to check with pt if she was up to participating.  Upon PT entering room, pt resting but c/o 8/10 R shoulder pain.  Pt reporting not being up to participating in PT at this moment d/t pain.  Will re-attempt PT evaluation tomorrow.  Hendricks Limes, PT 12/20/18, 3:18 PM [phone]

## 2018-12-20 NOTE — Care Management Note (Signed)
Case Management Note  Patient Details  Name: Kristin Stuart MRN: [id number] Date of Birth: [birth year]  Subjective/Objective:                  Met with patient to discuss DC plan and needs, Patient was provided the [facility] list for choice per CMS.gov, Patient wants to use [facility] for [facility], Notified [place] with [employer].  Patient is sleepy and does not feel well, and would like to continue our assessment later   Action/Plan: [facility] list provided per CMS.gov, [facility] chosen, [place] notified   Expected Discharge Date:                  Expected Discharge Plan:     In-House Referral:     Discharge planning Services  CM Consult  Post Acute Care Choice:  Home Health Choice offered to:     DME Arranged:    DME Agency:     HH Arranged:  PT, OT HH Agency:  [facility]  Status of Service:     If discussed at [place] of Stay Meetings, dates discussed:    Additional Comments:  Su Hilt, RN 12/20/2018, 3:44 PM

## 2018-12-20 NOTE — Anesthesia Procedure Notes (Signed)
Procedure Name: Intubation Date/Time: 12/20/2018 10:39 AM Performed by: Darrol Jump, CRNA Pre-anesthesia Checklist: Patient identified, Emergency Drugs available, Suction available and Patient being monitored Patient Re-evaluated:Patient Re-evaluated prior to induction Oxygen Delivery Method: Circle system utilized Preoxygenation: Pre-oxygenation with 100% oxygen Induction Type: IV induction Laryngoscope Size: 3 and Miller Grade View: Grade I Tube size: 7.0 mm Number of attempts: 1 Airway Equipment and Method: Stylet Placement Confirmation: ETT inserted through vocal cords under direct vision,  positive ETCO2 and breath sounds checked- equal and bilateral Secured at: 21 cm Tube secured with: Tape Dental Injury: Teeth and Oropharynx as per pre-operative assessment

## 2018-12-20 NOTE — Anesthesia Post-op Follow-up Note (Signed)
Anesthesia QCDR form completed.        

## 2018-12-20 NOTE — Clinical Social Work Note (Signed)
CSW received referral for SNF.  Case discussed with case manager and plan is to discharge home with home health.  CSW to sign off please re-consult if social work needs arise.  Prairie Stenberg R. Eretria Manternach, MSW, LCSW [phone]  

## 2018-12-20 NOTE — Anesthesia Preprocedure Evaluation (Addendum)
Anesthesia Evaluation  Patient identified by MRN, date of birth, ID band Patient awake    Reviewed: Allergy & Precautions, NPO status , Patient's Chart, lab work & pertinent test results, reviewed documented beta blocker date and time   History of Anesthesia Complications (+) PONV and history of anesthetic complications  Airway Mallampati: II  TM Distance: <3 FB Neck ROM: Full    Dental no notable dental hx. (+) Teeth Intact   Pulmonary former smoker,    Pulmonary exam normal        Cardiovascular hypertension, Pt. on medications and Pt. on home beta blockers Normal cardiovascular exam     Neuro/Psych  Headaches, TIA Neuromuscular disease (fibromyalgia) negative psych ROS   GI/Hepatic Neg liver ROS, hiatal hernia, GERD  Medicated and Controlled,  Endo/Other  Hypothyroidism   Renal/GU negative Renal ROS  negative genitourinary   Musculoskeletal  (+) Arthritis , Osteoarthritis,  Fibromyalgia -  Abdominal Normal abdominal exam  (+)   Peds negative pediatric ROS (+)  Hematology  (+) anemia ,   Anesthesia Other Findings Past Medical History:   Hypertension                                                 Hypothyroid                                                  Fibromyalgia                                                 Hypercholesteremia                                           Arthritis                                                    GERD (gastroesophageal reflux disease)                       TIA (transient ischemic attack)                 2012         History of hiatal hernia                                     PONV (postoperative nausea and vomiting)                     Edema  Comment:LEGS/FEET   Stroke                                                         Comment:tia   Anemia                                                       Post  laminectomy syndrome                                    Edema                                                          Comment:legs/feet  Patient preop has complained of right shoulder and lateral arm pain.  Reproductive/Obstetrics                           Anesthesia Physical  Anesthesia Plan  ASA: III  Anesthesia Plan: General   Post-op Pain Management:  Regional for Post-op pain   Induction: Intravenous  PONV Risk Score and Plan:   Airway Management Planned: Oral ETT  Additional Equipment:   Intra-op Plan:   Post-operative Plan: Extubation in OR  Informed Consent: I have reviewed the patients History and Physical, chart, labs and discussed the procedure including the risks, benefits and alternatives for the proposed anesthesia with the patient or authorized representative who has indicated his/her understanding and acceptance.     Dental advisory given  Plan Discussed with: CRNA and Surgeon  Anesthesia Plan Comments: (Explained to the patient that we were going to perform an interscalene block to help with postop pain control.  Risks were discussed which included, but not limited to, pneumothorax, nerve damage, infection, block not complete or covering all aspects of the shoulder, or upper arm, and difficulty with breathing or low saturations which may require support.  Patient understands risks and wishes to proceed.)      Anesthesia Quick Evaluation

## 2018-12-20 NOTE — Op Note (Signed)
12/20/2018  1:03 PM  Patient:   Kristin Stuart  Pre-Op Diagnosis:   Primary osteoarthritis with significant rotator cuff tendinopathy, right shoulder.  Post-Op Diagnosis:   Same  Procedure:   Reverse reverse total shoulder arthroplasty.  Surgeon:   Maryagnes Amos, MD  Assistant:   Horris Latino, PA-C; Trilby Drummer, PA-S  Anesthesia:   General endotracheal with an interscalene block using Exparel placed preoperatively by the anesthesiologist.  Findings:   As above.  Complications:   None  EBL:   175 cc  Fluids:   1100 cc crystalloid  UOP:   None  TT:   None  Drains:   None  Closure:   Staples  Implants:   All press-fit [employer] Comprehensive system with a #11 micro-humeral stem, a 40 mm +6 mm offset humeral tray with a standard insert, and a mini-base plate with a 36 mm glenosphere.  Brief Clinical Note:   The patient is a 68 year old female with a long history of progressively worsening right shoulder pain. Her history and examination are consistent with degenerative joint disease confirmed by plain radiographs. Her symptoms have persisted despite medications, activity modification, injections, etc. An MRI scan demonstrated significant tendinopathy changes with partial-thickness tearing. Therefore, the patient presents at this time for a reverse right total shoulder arthroplasty.  Procedure:   The patient underwent placement of an interscalene block utilizing Exparel by the anesthesiologist in the preoperative holding area before being brought into the operating room and lain in the supine position. The patient then underwent general endotracheal intubation and anesthesia before the patient was repositioned in the beach chair position using the beach chair positioner. The right shoulder and upper extremity were prepped with ChloraPrep solution before being draped sterilely. Preoperative antibiotics were administered. A standard anterior approach to the shoulder was made through  an approximately 4-5 inch incision. The incision was carried down through the subcutaneous tissues to expose the deltopectoral fascia. The interval between the deltoid and pectoralis muscles was identified and this plane developed, retracting the cephalic vein laterally with the deltoid muscle. The conjoined tendon was identified. Its lateral margin was dissected and the Kolbel self-retraining retractor inserted. The "three sisters" were identified and cauterized. Bursal tissues were removed to improve visualization. The subscapularis tendon was released from its attachment to the lesser tuberosity 1 cm proximal to its insertion and several tagging sutures placed. The inferior capsule was released with care after identifying and protecting the axillary nerve. The proximal humeral cut was made at approximately 25 of retroversion using the extra-medullary guide.   Attention was redirected to the glenoid. The labrum was debrided circumferentially before the center of the glenoid was marked with electrocautery. The guidewire was drilled into the glenoid neck using the appropriate guide. After verifying its position, it was overreamed with the mini-baseplate reamer to create a flat surface. The permanent mini-baseplate was impacted into place. It was stabilized with a 35 x 6.5 mm central screw and four peripheral screws. Locking screws were placed superiorly, inferiorly, anteriorly, and posteriorly. The permanent 36 mm glenosphere was then impacted into place and its Morse taper locking mechanism verified using manual distraction.  Attention was directed to the humeral side. The humeral canal was reamed sequentially beginning with the end-cutting reamer then progressing from a 4 mm reamer up to a 12 mm reamer. This provided excellent circumferential chatter. The canal was broached beginning with a #9 broach and progressing to a #12 broach. This was left in place and a trial reduction performed  using the standard  trial humeral platform. The shoulder was unable to be properly relocated due to excessive tension. Therefore, the stem was removed and an additional few millimeters cut off of the proximal humerus. The canal was re-broached and ended up being downsized to a #11 broach. This was left in place and a trial reduction performed using the standard trial humeral platform with the 40 mm +6 mm offset tray. The arm demonstrated excellent range of motion as the hand could be brought across the chest to the opposite shoulder and brought to the top of the patient's head and to the patient's ear. The shoulder appeared stable throughout this range of motion. The joint was dislocated and the trial components removed. The permanent #11 micro-stem was impacted into place with care taken to maintain the appropriate version. The permanent 40 mm +6 mm offset humeral platform with the standard insert was put together on the back table and impacted into place. Again, the [employer] taper locking mechanism was verified using manual distraction. The shoulder was relocated using firm two finger pressure and again placed through a range of motion with the findings as described above.  The wound was copiously irrigated with bacitracin saline solution using the jet lavage system before a total of 30 cc of 0.5% Sensorcaine with epinephrine was injected into the pericapsular and peri-incisional tissues to help with postoperative analgesia. The subscapularis tendon was reapproximated using #2 FiberWire interrupted sutures. The deltopectoral interval was closed using #0 Vicryl interrupted sutures before the subcutaneous tissues were closed using 2-0 Vicryl interrupted sutures. The skin was closed using staples. Prior to closing the skin, 1 g of transexemic acid in 10 cc of normal saline was injected intra-articularly to help with postoperative bleeding. A sterile occlusive dressing was applied to the wound before the arm was placed into a shoulder  immobilizer with an abduction pillow. A Polar Care system also was applied to the shoulder. The patient was then transferred back to a hospital bed before being awakened, extubated, and returned to the recovery room in satisfactory condition after tolerating the procedure well.

## 2018-12-20 NOTE — Progress Notes (Signed)
Pt states she had a reaction to Vancomycin in the OR this morning. On call doc paged. MD Signa Kell returned call and notified. MD ordering Ancef and D/C Vancomycin.

## 2018-12-21 LAB — CBC WITH DIFFERENTIAL/PLATELET
Abs Immature Granulocytes: 0.02 10*3/uL (ref 0.00–0.07)
Basophils Absolute: 0 10*3/uL (ref 0.0–0.1)
Basophils Relative: 0 %
Eosinophils Absolute: 0.1 10*3/uL (ref 0.0–0.5)
Eosinophils Relative: 1 %
HCT: 32.4 % — ABNORMAL LOW (ref 36.0–46.0)
Hemoglobin: 10.8 g/dL — ABNORMAL LOW (ref 12.0–15.0)
Immature Granulocytes: 0 %
Lymphocytes Relative: 17 %
Lymphs Abs: 1.2 10*3/uL (ref 0.7–4.0)
MCH: 30.2 pg (ref 26.0–34.0)
MCHC: 33.3 g/dL (ref 30.0–36.0)
MCV: 90.5 fL (ref 80.0–100.0)
Monocytes Absolute: 0.4 10*3/uL (ref 0.1–1.0)
Monocytes Relative: 5 %
NRBC: 0 % (ref 0.0–0.2)
Neutro Abs: 5.2 10*3/uL (ref 1.7–7.7)
Neutrophils Relative %: 77 %
Platelets: 223 10*3/uL (ref 150–400)
RBC: 3.58 MIL/uL — AB (ref 3.87–5.11)
RDW: 10.7 % — ABNORMAL LOW (ref 11.5–15.5)
WBC: 6.9 10*3/uL (ref 4.0–10.5)

## 2018-12-21 LAB — BASIC METABOLIC PANEL
Anion gap: 8 (ref 5–15)
BUN: 15 mg/dL (ref 8–23)
CO2: 27 mmol/L (ref 22–32)
Calcium: 8.4 mg/dL — ABNORMAL LOW (ref 8.9–10.3)
Chloride: 101 mmol/L (ref 98–111)
Creatinine, Ser: 0.75 mg/dL (ref 0.44–1.00)
GFR calc Af Amer: >60 mL/min (ref >60)
GFR calc non Af Amer: >60 mL/min (ref >60)
Glucose, Bld: 104 mg/dL — ABNORMAL HIGH (ref 70–99)
Potassium: 3.8 mmol/L (ref 3.5–5.1)
Sodium: 136 mmol/L (ref 135–145)

## 2018-12-21 MED ORDER — ASPIRIN EC 325 MG PO TBEC: 325.0000 mg | DELAYED_RELEASE_TABLET | Freq: Every day | ORAL | 0 refills | Status: DC

## 2018-12-21 MED ORDER — OXYCODONE HCL 5 MG PO TABS: 5.0000 mg | ORAL_TABLET | ORAL | 0 refills | Status: DC | PRN

## 2018-12-21 MED ORDER — TRAMADOL HCL 50 MG PO TABS: 50.0000 mg | ORAL_TABLET | Freq: Four times a day (QID) | ORAL | 0 refills | Status: DC

## 2018-12-21 NOTE — Evaluation (Signed)
Physical Therapy Evaluation Patient Details Name: Kristin Stuart MRN: [id number] DOB: 12/27/1950 Today's Date: 12/21/2018   History of Present Illness  68 yo female s/p R reverse TSA on 12/20/2018. PMHx includes HTN, OA, fibromyalgia, Raynauds, hiatal hernia, GERD, hypothyroidism, TIA (2012).  Clinical Impression  Prior to hospital admission, pt was independent with functional mobility and working.  Pt lives alone in 2 level home (pt sleeps on 2nd floor).  Currently pt is SBA semi-supine to sit; SBA with transfers; and CGA ambulating around nursing loop (no AD).  Overall pt steady with ambulation but decreased gait velocity noted and distance limited d/t c/o R shoulder pain with activity.  Deferred stairs trial d/t pt's c/o significant R shoulder pain with any mobility.  Pt agreeable to sitting in recliner end of session and therapist attempted to get pt as comfortable as possible.  Pt appearing tearful sitting in recliner and therapist attempted to talk with pt to discuss any way therapist was able to help make her more comfortable and also discussed option of getting pt back to bed to improve comfort but pt declined any attempts at therapist assisting pt and then pt reported she did not want to talk anymore so session ended.  Therapist called nurse to discuss pt's current status including pain concerns.  Pt would benefit from skilled PT to address noted impairments and functional limitations (see below for any additional details).  Will continue to progress pt per pt tolerance.    Follow Up Recommendations Follow surgeon's recommendation for DC plan and follow-up therapies    Equipment Recommendations  None recommended by PT    Recommendations for Other Services OT consult     Precautions / Restrictions Precautions Precautions: Shoulder Shoulder Interventions: Shoulder sling/immobilizer;At all times;Off for dressing/bathing/exercises Precaution Booklet Issued: Yes  (comment) Restrictions Weight Bearing Restrictions: Yes RUE Weight Bearing: Non weight bearing      Mobility  Bed Mobility Overal bed mobility: Needs Assistance Bed Mobility: Supine to Sit     Supine to sit: Supervision;HOB elevated     General bed mobility comments: increased time to perform on own  Transfers Overall transfer level: Needs assistance Equipment used: None Transfers: Sit to/from Stand Sit to Stand: Supervision         General transfer comment: steady strong transfer noted  Ambulation/Gait Ambulation/Gait assistance: Min guard Gait Distance (Feet): 235 Feet Assistive device: None   Gait velocity: decreased   General Gait Details: decreased B step length; pt steady but limited d/t R shoulder pain  Stairs Stairs: (Deferred d/t significant R shoulder pain)          Wheelchair Mobility    Modified Rankin (Stroke Patients Only)       Balance Overall balance assessment: Needs assistance Sitting-balance support: Feet supported;No upper extremity supported Sitting balance-Leahy Scale: Good Sitting balance - Comments: steady sitting reaching within BOS with L UE   Standing balance support: No upper extremity supported;During functional activity Standing balance-Leahy Scale: Good Standing balance comment: steady with ambulation (no UE support)                             Pertinent Vitals/Pain Pain Assessment: 0-10 Pain Score: 8  Pain Descriptors / Indicators: Aching;Sharp;Sore;Grimacing;Guarding;Crying Pain Intervention(s): Limited activity within patient's tolerance;Monitored during session;Premedicated before session;Repositioned;Patient requesting pain meds-RN notified;Other (comment)(polar care applied and activated)    Home Living Family/patient expects to be discharged to:: Private residence Living Arrangements: Alone   Type of Home:  Other(Comment)(condo) Home Access: Level entry     Home Layout: Two level;1/2 bath on  main level;Bed/bath upstairs Home Equipment: None      Prior Function Level of Independence: Independent         Comments: Pt independent; no falls.  Working part time.     Hand Dominance   Dominant Hand: Right    Extremity/Trunk Assessment   Upper Extremity Assessment Upper Extremity Assessment: Defer to OT evaluation    Lower Extremity Assessment Lower Extremity Assessment: Generalized weakness    Cervical / Trunk Assessment Cervical / Trunk Assessment: Normal Cervical / Trunk Exceptions: H/o cervical surgeries  Communication   Communication: No difficulties  Cognition Arousal/Alertness: Awake/alert Behavior During Therapy: WFL for tasks assessed/performed Overall Cognitive Status: Within Functional Limits for tasks assessed                                        General Comments   Nursing cleared pt for participation in physical therapy.  Pt agreeable to PT session.    Exercises     Assessment/Plan    PT Assessment Patient needs continued PT services  PT Problem List Decreased strength;Decreased activity tolerance;Decreased balance;Decreased mobility;Decreased knowledge of precautions;Pain;Decreased skin integrity       PT Treatment Interventions DME instruction;Gait training;Stair training;Functional mobility training;Therapeutic activities;Therapeutic exercise;Balance training;Patient/family education    PT Goals (Current goals can be found in the Care Plan section)  Acute Rehab PT Goals Patient Stated Goal: have less pain PT Goal Formulation: With patient Time For Goal Achievement: 01/04/19 Potential to Achieve Goals: Fair    Frequency BID   Barriers to discharge        Co-evaluation               AM-PAC PT "6 Clicks" Mobility  Outcome Measure Help needed turning from your back to your side while in a flat bed without using bedrails?: A Little Help needed moving from lying on your back to sitting on the side of a flat  bed without using bedrails?: A Little Help needed moving to and from a bed to a chair (including a wheelchair)?: A Little Help needed standing up from a chair using your arms (e.g., wheelchair or bedside chair)?: A Little Help needed to walk in hospital room?: A Little Help needed climbing 3-5 steps with a railing? : A Little 6 Click Score: 18    End of Session Equipment Utilized During Treatment: Gait belt;Other (comment)(R shoulder immobilizer sling with aBduction pillow) Activity Tolerance: Patient limited by pain Patient left: in chair;with call bell/phone within reach;with chair alarm set;Other (comment)(R UE supported with pillows; polar care in place) Nurse Communication: Mobility status;Patient requests pain meds;Precautions;Weight bearing status;Other (comment)(Pt tearful and c/o 8/10 R shoulder pain sitting in chair) PT Visit Diagnosis: Other abnormalities of gait and mobility (R26.89);Muscle weakness (generalized) (M62.81);Difficulty in walking, not elsewhere classified (R26.2);Pain Pain - Right/Left: Right Pain - part of body: Shoulder    Time: 1015-1036 PT Time Calculation (min) (ACUTE ONLY): 21 min   Charges:   PT Evaluation $PT Eval Low Complexity: 1 Low PT Treatments $Therapeutic Exercise: 8-22 mins       Hendricks Limes, PT 12/21/18, 11:08 AM [phone]

## 2018-12-21 NOTE — Progress Notes (Signed)
OT Cancellation Note  Patient Details Name: Kristin Stuart MRN: [id number] DOB: 07/18/1951   Cancelled Treatment:    Reason Eval/Treat Not Completed: Pain limiting ability to participate. Pt reports being in too much pain this am to participate in OT treatment session. Pt tearful. Will re-attempt this afternoon as pt is able to tolerate therapy.   Richrd Prime, MPH, MS, OTR/L ascom [phone] 12/21/18, 12:00 PM

## 2018-12-21 NOTE — Discharge Instructions (Signed)
Diet: As you were doing prior to hospitalization  ° °Shower:  May shower but keep the wounds dry, use an occlusive plastic wrap, NO SOAKING IN TUB.  If the bandage gets wet, change with a clean dry gauze. ° °Dressing:  You may change your dressing as needed. Change the dressing with sterile gauze dressing.   ° °Activity:  Increase activity slowly as tolerated, but follow the weight bearing instructions below.  No lifting or driving for 6 weeks. ° °Weight Bearing:   Non-weightbearing to the right arm. ° °Blood Clot Prevention: Take 1 325 mg aspirin daily for 14 days. ° °To prevent constipation: you may use a stool softener such as - ° °Colace (over the counter) 100 mg by mouth twice a day  °Drink plenty of fluids (prune juice may be helpful) and high fiber foods °Miralax (over the counter) for constipation as needed.   ° °Itching:  If you experience itching with your medications, try taking only a single pain pill, or even half a pain pill at a time.  You may take up to 10 pain pills per day, and you can also use benadryl over the counter for itching or also to help with sleep.  ° °Precautions:  If you experience chest pain or shortness of breath - call 911 immediately for transfer to the hospital emergency department!! ° °If you develop a fever greater that 101 F, purulent drainage from wound, increased redness or drainage from wound, or calf pain-Call [facility]                                              °Follow- Up Appointment:  Please call for an appointment to be seen in 2 weeks at [facility] °

## 2018-12-21 NOTE — Progress Notes (Addendum)
Occupational Therapy Treatment Patient Details Name: Kristin Stuart MRN: [id number] DOB: 1950/11/03 Today's Date: 12/21/2018    History of present illness 68 yo female s/p R reverse TSA on 12/20/2018. PMHx includes HTN, OA, fibromyalgia, Raynauds, hiatal hernia, GERD, hypothyroidism, TIA (2012).   OT comments  Pt seen for OT tx this date, limited by R shoulder pain. Pt instructed in hemi dressing techniques and underarm grooming strategies to maintain precautions while maximizing independence and safety. Pt declined to trial herself 2/2 pain. Pt overall limited by pain. Declined additional therapy this date 2/2 pain. Continues to benefit from skilled OT services to support optimal return to PLOF. Pt will require substantial assist at home in order to return safely. Will benefit from a [facility] aide.    Follow Up Recommendations  Home health OT;Other (comment)(HH Aide, HHPT)    Equipment Recommendations  3 in 1 bedside commode    Recommendations for Other Services      Precautions / Restrictions Precautions Precautions: Shoulder Shoulder Interventions: Shoulder sling/immobilizer;At all times;Off for dressing/bathing/exercises Precaution Booklet Issued: Yes (comment) Restrictions Weight Bearing Restrictions: Yes RUE Weight Bearing: Non weight bearing       Mobility Bed Mobility   General bed mobility comments: deferred, up in recliner at start/end of session  Transfers       General transfer comment: pt declined 2/2 pain    Balance Overall balance assessment: Needs assistance Sitting-balance support: Feet supported;No upper extremity supported Sitting balance-Leahy Scale: Good Sitting balance - Comments: steady sitting reaching within BOS with L UE                             ADL either performed or assessed with clinical judgement   ADL Overall ADL's : Needs assistance/impaired Eating/Feeding: Modified independent;Sitting   Grooming: Sitting;Set  up;Supervision/safety;Minimal assistance   Upper Body Bathing: Minimal assistance;Sitting;Moderate assistance   Lower Body Bathing: Sit to/from stand;Minimal assistance;Moderate assistance   Upper Body Dressing : Sitting;Moderate assistance   Lower Body Dressing: Sit to/from stand;Minimal assistance;Moderate assistance                       Vision Baseline Vision/History: Wears glasses Wears Glasses: Reading only Patient Visual Report: No change from baseline     Perception     Praxis      Cognition Arousal/Alertness: Awake/alert Behavior During Therapy: WFL for tasks assessed/performed Overall Cognitive Status: Within Functional Limits for tasks assessed                                          Exercises Other Exercises Other Exercises: pt instructed in RUE positioning for sleep and when sitting up in chair with pillows optimally positioned Other Exercises: Pt instructed in underarm grooming strategies and hemi-dressing techniques to maximize independence while maintaining RUE precautions. Pt declined to trial 2/2 pain.   Shoulder Instructions       General Comments      Pertinent Vitals/ Pain       Pain Assessment: 0-10 Pain Score: 7  Pain Location: R shoulder Pain Descriptors / Indicators: Aching;Sharp;Sore;Grimacing;Guarding Pain Intervention(s): Limited activity within patient's tolerance;Monitored during session;Premedicated before session;Ice applied;Repositioned  Home Living Family/patient expects to be discharged to:: Private residence Living Arrangements: Alone   Type of Home: Other(Comment)(condo) Home Access: Level entry     Home Layout: Two level;1/2 bath  on main level;Bed/bath upstairs Alternate Level Stairs-Number of Steps: flight of steps with B railings to 2nd floor bedroom   Bathroom Shower/Tub: Tub/shower unit   Bathroom Toilet: Standard     Home Equipment: None          Prior Functioning/Environment Level  of Independence: Independent        Comments: Pt independent; no falls.  Working part time.   Frequency  Min 2X/week        Progress Toward Goals  OT Goals(current goals can now be found in the care plan section)  Progress towards OT goals: Not progressing toward goals - comment(very pain limited)  Acute Rehab OT Goals Patient Stated Goal: have less pain OT Goal Formulation: With patient Time For Goal Achievement: 01/03/19 Potential to Achieve Goals: Good  Plan Discharge plan remains appropriate;Frequency remains appropriate    Co-evaluation                 AM-PAC OT "6 Clicks" Daily Activity     Outcome Measure   Help from another person eating meals?: None Help from another person taking care of personal grooming?: A Little Help from another person toileting, which includes using toliet, bedpan, or urinal?: A Little Help from another person bathing (including washing, rinsing, drying)?: A Lot Help from another person to put on and taking off regular upper body clothing?: A Lot Help from another person to put on and taking off regular lower body clothing?: A Lot 6 Click Score: 16    End of Session    OT Visit Diagnosis: Other abnormalities of gait and mobility (R26.89);Muscle weakness (generalized) (M62.81);Pain Pain - Right/Left: Right Pain - part of body: Shoulder   Activity Tolerance Patient limited by pain   Patient Left in chair;with call bell/phone within reach;with chair alarm set;Other (comment)(polar care and sling in proper position)   Nurse Communication          Time: 3474-2595 OT Time Calculation (min): 11 min  Charges: OT General Charges $OT Visit: 1 Visit OT Treatments $Self Care/Home Management : 8-22 mins  Richrd Prime, MPH, MS, OTR/L ascom [phone] 12/21/18, 1:56 PM

## 2018-12-21 NOTE — Progress Notes (Signed)
  Subjective: 1 Day Post-Op Procedure(s) (LRB): REVERSE SHOULDER ARTHROPLASTY (Right) Patient reports pain as moderate.   Patient is well but does report moderate pain to the right shoulder. Plan is to go Home after hospital stay. Negative for chest pain and shortness of breath Fever: no Gastrointestinal:Negative for nausea and vomiting  Objective: Vital signs in last 24 hours: Temp:  [97.4 F (36.3 C)-98.2 F (36.8 C)] 97.6 F (36.4 C) (02/19 0423) Pulse Rate:  [64-97] 64 (02/19 0423) Resp:  [10-20] 19 (02/19 0423) BP: (105-164)/(61-87) 140/72 (02/19 0423) SpO2:  [96 %-100 %] 99 % (02/19 0423) Weight:  [72.6 kg] 72.6 kg (02/18 0831)  Intake/Output from previous day:  Intake/Output Summary (Last 24 hours) at 12/21/2018 0746 Last data filed at 12/20/2018 2303 Gross per 24 hour  Intake 1962.68 ml  Output 175 ml  Net 1787.68 ml    Intake/Output this shift: No intake/output data recorded.  Labs: Recent Labs    12/21/18 0346  HGB 10.8*   Recent Labs    12/21/18 0346  WBC 6.9  RBC 3.58*  HCT 32.4*  PLT 223   Recent Labs    12/21/18 0346  NA 136  K 3.8  CL 101  CO2 27  BUN 15  CREATININE 0.75  GLUCOSE 104*  CALCIUM 8.4*   No results for input(s): LABPT, INR in the last 72 hours.   EXAM General - Patient is Alert, Appropriate and Oriented Extremity - ABD soft Dorsiflexion/Plantar flexion intact Incision: dressing C/D/I No cellulitis present  Patient is intact to light touch to the right arm. Dressing/Incision - clean, dry, no drainage Motor Function - intact, moving foot and toes well on exam.   Past Medical History:  Diagnosis Date  . Anemia   . Arthritis   . Edema    LEGS/FEET  . Edema    legs/feet  . Fibromyalgia   . GERD (gastroesophageal reflux disease)   . History of hiatal hernia   . Hypercholesteremia   . Hypertension   . Hypothyroid   . PONV (postoperative nausea and vomiting)   . Post laminectomy syndrome   . Raynaud's syndrome    . Stroke (HCC)    tia  . TIA (transient ischemic attack) 2012    Assessment/Plan: 1 Day Post-Op Procedure(s) (LRB): REVERSE SHOULDER ARTHROPLASTY (Right) Active Problems:   Status post reverse total shoulder replacement, right  Estimated body mass index is 26.63 kg/m as calculated from the following:   Height as of this encounter: 5\' 5"  (1.651 m).   Weight as of this encounter: 72.6 kg. Advance diet Up with therapy D/C IV fluids when tolerating po intake.  Labs reviewed this AM. Hg 10.8.  No recent fevers. Pt reports that she is passing gas but has not had a BM. Up with therapy today.   Possible discharge home today pending pain control and progress with PT.  DVT Prophylaxis - Lovenox, Foot Pumps and TED hose Non-weightbearing to the right arm.  Valeria Batman, PA-C [facility] Orthopaedic Surgery 12/21/2018, 7:46 AM

## 2018-12-21 NOTE — Progress Notes (Signed)
Physical Therapy Treatment Patient Details Name: Kristin Stuart MRN: [id number] DOB: 1951/07/01 Today's Date: 12/21/2018    History of Present Illness 68 yo female s/p R reverse TSA on 12/20/2018. PMHx includes HTN, OA, fibromyalgia, Raynauds, hiatal hernia, GERD, hypothyroidism, TIA (2012).    PT Comments    Pt initially declining to work with PT d/t 5/10 R shoulder pain (pt resting in recliner and reporting being uncomfortable) but then agreeable to shorter walk and then getting back to bed.  Pt appeared to tolerate ambulating 195 feet with CGA fairly well and pain 5/10 R shoulder end of session resting in bed.  Pt declined stairs trial d/t R shoulder pain.  Will continue to progress pt with strengthening and trial stairs at next session as able.    Follow Up Recommendations  Follow surgeon's recommendation for DC plan and follow-up therapies     Equipment Recommendations  None recommended by PT    Recommendations for Other Services OT consult     Precautions / Restrictions Precautions Precautions: Shoulder Shoulder Interventions: Shoulder sling/immobilizer;At all times;Off for dressing/bathing/exercises Precaution Booklet Issued: Yes (comment) Restrictions Weight Bearing Restrictions: Yes RUE Weight Bearing: Non weight bearing    Mobility  Bed Mobility Overal bed mobility: Modified Independent Bed Mobility: Sit to Supine       Sit to supine: Modified independent (Device/Increase time)   General bed mobility comments: mild increased time to perform on own but no assist required  Transfers Overall transfer level: Needs assistance Equipment used: None Transfers: Sit to/from Stand Sit to Stand: Supervision         General transfer comment: x1 trial from recliner; x1 trial from bed; x1 trial from [facility] over toilet; no vc's required; steady and safe  Ambulation/Gait Ambulation/Gait assistance: Min guard Gait Distance (Feet): 195 Feet Assistive device: None   Gait  velocity: decreased   General Gait Details: decreased B step length; steady   Stairs             Wheelchair Mobility    Modified Rankin (Stroke Patients Only)       Balance Overall balance assessment: Needs assistance Sitting-balance support: Feet supported;No upper extremity supported Sitting balance-Leahy Scale: Good Sitting balance - Comments: steady sitting reaching within BOS with L UE   Standing balance support: No upper extremity supported;During functional activity Standing balance-Leahy Scale: Good Standing balance comment: steady with ambulation (no UE support)                            Cognition Arousal/Alertness: Awake/alert Behavior During Therapy: WFL for tasks assessed/performed Overall Cognitive Status: Within Functional Limits for tasks assessed                                        Exercises     General Comments   Nursing cleared pt for participation in physical therapy.  Pt agreeable to PT session.  After sitting on edge of bed after ambulating, pt requesting to toilet so pt assisted to bathroom and then back to bed.      Pertinent Vitals/Pain Pain Assessment: 0-10 Pain Score: 5  Pain Location: R shoulder Pain Descriptors / Indicators: Sore Pain Intervention(s): Limited activity within patient's tolerance;Monitored during session;Repositioned;Patient requesting pain meds-RN notified;RN gave pain meds during session;Other (comment)(polar care applied and activated)  HR WFL during session.    Home Living  Prior Function            PT Goals (current goals can now be found in the care plan section) Acute Rehab PT Goals Patient Stated Goal: have less pain PT Goal Formulation: With patient Time For Goal Achievement: 01/04/19 Potential to Achieve Goals: Fair Progress towards PT goals: Progressing toward goals    Frequency    BID      PT Plan Current plan remains  appropriate    Co-evaluation              AM-PAC PT "6 Clicks" Mobility   Outcome Measure  Help needed turning from your back to your side while in a flat bed without using bedrails?: None Help needed moving from lying on your back to sitting on the side of a flat bed without using bedrails?: None Help needed moving to and from a bed to a chair (including a wheelchair)?: A Little Help needed standing up from a chair using your arms (e.g., wheelchair or bedside chair)?: A Little Help needed to walk in hospital room?: A Little Help needed climbing 3-5 steps with a railing? : A Little 6 Click Score: 20    End of Session Equipment Utilized During Treatment: Gait belt;Other (comment)(R shoulder immobilizer with abduction pillow) Activity Tolerance: Patient limited by pain Patient left: in bed;with call bell/phone within reach;with bed alarm set;with nursing/sitter in room Nurse Communication: Mobility status;Precautions;Weight bearing status(Pt's pain status) PT Visit Diagnosis: Other abnormalities of gait and mobility (R26.89);Muscle weakness (generalized) (M62.81);Difficulty in walking, not elsewhere classified (R26.2);Pain Pain - Right/Left: Right Pain - part of body: Shoulder     Time: 1314-3888 PT Time Calculation (min) (ACUTE ONLY): 38 min  Charges:  $Gait Training: 8-22 mins $Therapeutic Exercise: 8-22 mins $Therapeutic Activity: 8-22 mins                    Hendricks Limes, PT 12/21/18, 5:01 PM [phone]

## 2018-12-22 LAB — BASIC METABOLIC PANEL
Anion gap: 7 (ref 5–15)
BUN: 12 mg/dL (ref 8–23)
CO2: 28 mmol/L (ref 22–32)
Calcium: 8.2 mg/dL — ABNORMAL LOW (ref 8.9–10.3)
Chloride: 103 mmol/L (ref 98–111)
Creatinine, Ser: 0.74 mg/dL (ref 0.44–1.00)
GFR calc non Af Amer: >60 mL/min (ref >60)
Glucose, Bld: 102 mg/dL — ABNORMAL HIGH (ref 70–99)
Potassium: 3.6 mmol/L (ref 3.5–5.1)
SODIUM: 138 mmol/L (ref 135–145)

## 2018-12-22 LAB — SURGICAL PATHOLOGY

## 2018-12-22 NOTE — Progress Notes (Signed)
Patient says she is in too much pain for a bath at this time.  I insured her we would help her wash up when her pain level was better.

## 2018-12-22 NOTE — Progress Notes (Signed)
OT Cancellation Note  Patient Details Name: Kristin Stuart MRN: [id number] DOB: 10-07-1951   Cancelled Treatment:    Reason Eval/Treat Not Completed: Other (comment). Upon attempt to treat, pt in bathroom with nurse tech. Will re-attempt as schedule permits.  Richrd Prime, MPH, MS, OTR/L ascom [phone] 12/22/18, 9:54 AM

## 2018-12-22 NOTE — Discharge Planning (Addendum)
Patient IV removed.  RN assessment and VS revealed stability for DC to home with [facility].  Discharge papers will be given, explained and educated.  Will be informed of suggested FU visit and FU visit has been confirmed and set up Horris Latino PA on 3/4 245P - to removed staples and assess healing. Will discuss immobilizer use at home, safe wound care, s/sx of infection and when to contact the Dr.  Once ready, will be wheeled to front and patient being transported by self pay taxi to home (per her choice).

## 2018-12-22 NOTE — Care Management (Signed)
Met with patient to discuss DC plan since yesterday she was not feeling well.  The patient agrees to the choice made yesterday for [facility], [place] has already been notified, No DME needs The patient is using Home delivery for groceries.  I provided the patient with the resource list for transportation services.  She has stated that she will call her insurance thru [employer] and ask if they have any other resources as well.  The patient states that she will set up anything else on her own.

## 2018-12-22 NOTE — Progress Notes (Signed)
  Subjective: 2 Days Post-Op Procedure(s) (LRB): REVERSE SHOULDER ARTHROPLASTY (Right) Patient reports pain as moderate.   Patient is well but does report moderate pain to the right shoulder. Plan is to go Home after hospital stay. Negative for chest pain and shortness of breath Fever: no Gastrointestinal:Negative for nausea and vomiting  Objective: Vital signs in last 24 hours: Temp:  [97.8 F (36.6 C)-97.9 F (36.6 C)] 97.9 F (36.6 C) (02/20 0751) Pulse Rate:  [74-82] 82 (02/20 1106) Resp:  [17-18] 18 (02/20 0751) BP: (144-148)/(74-90) 148/86 (02/20 1106) SpO2:  [96 %-98 %] 96 % (02/20 0751)  Intake/Output from previous day:  Intake/Output Summary (Last 24 hours) at 12/22/2018 1242 Last data filed at 12/22/2018 0900 Gross per 24 hour  Intake 2202.75 ml  Output -  Net 2202.75 ml    Intake/Output this shift: Total I/O In: 240 [P.O.:240] Out: -   Labs: Recent Labs    12/21/18 0346  HGB 10.8*   Recent Labs    12/21/18 0346  WBC 6.9  RBC 3.58*  HCT 32.4*  PLT 223   Recent Labs    12/21/18 0346 12/22/18 0318  NA 136 138  K 3.8 3.6  CL 101 103  CO2 27 28  BUN 15 12  CREATININE 0.75 0.74  GLUCOSE 104* 102*  CALCIUM 8.4* 8.2*   No results for input(s): LABPT, INR in the last 72 hours.   EXAM General - Patient is Alert, Appropriate and Oriented Extremity - ABD soft Dorsiflexion/Plantar flexion intact Incision: dressing C/D/I No cellulitis present  Patient is intact to light touch to the right arm. Dressing/Incision - clean, dry, no drainage Motor Function - intact, moving foot and toes well on exam.   Past Medical History:  Diagnosis Date  . Anemia   . Arthritis   . Edema    LEGS/FEET  . Edema    legs/feet  . Fibromyalgia   . GERD (gastroesophageal reflux disease)   . History of hiatal hernia   . Hypercholesteremia   . Hypertension   . Hypothyroid   . PONV (postoperative nausea and vomiting)   . Post laminectomy syndrome   . Raynaud's  syndrome   . Stroke (HCC)    tia  . TIA (transient ischemic attack) 2012    Assessment/Plan: 2 Days Post-Op Procedure(s) (LRB): REVERSE SHOULDER ARTHROPLASTY (Right) Active Problems:   Status post reverse total shoulder replacement, right  Estimated body mass index is 26.63 kg/m as calculated from the following:   Height as of this encounter: 5\' 5"  (1.651 m).   Weight as of this encounter: 72.6 kg. Advance diet Up with therapy D/C IV fluids when tolerating po intake.  Labs reviewed this AM. No recent fevers. Pt reports that she has had a BM. Plan for discharge home this afternoon with HHPT.  DVT Prophylaxis - Lovenox, Foot Pumps and TED hose Non-weightbearing to the right arm.  Valeria Batman, PA-C [facility] Orthopaedic Surgery 12/22/2018, 12:42 PM

## 2018-12-22 NOTE — Discharge Summary (Signed)
Physician Discharge Summary  Patient ID: Kristin Stuart MRN: [id number] DOB/AGE: 70-Nov-1952 70 y.o.  Admit date: 12/20/2018 Discharge date: 12/22/2018  Admission Diagnoses:  PRIMARY OSTEOARTHRITIS OF RIGHT SHOULDER Primary osteoarthritis right shoulder  Discharge Diagnoses: Patient Active Problem List   Diagnosis Date Noted  . 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 Bilateral occipital neuralgia 03/14/2015  . DDD (degenerative disc disease), lumbar 03/14/2015  . Facet syndrome, lumbar 03/14/2015  . DDD (degenerative disc disease), cervical 03/14/2015  . Cervical facet joint syndrome 03/14/2015  . Migraine 03/14/2015    Past Medical History:  Diagnosis Date  . Anemia   . Arthritis   . Edema    LEGS/FEET  . Edema    legs/feet  . Fibromyalgia   . GERD (gastroesophageal reflux disease)   . History of hiatal hernia   . Hypercholesteremia   . Hypertension   . Hypothyroid   . PONV (postoperative nausea and vomiting)   . Post laminectomy syndrome   . Raynaud's syndrome   . Stroke (HCC)    tia  . TIA (transient ischemic attack) 2012     Transfusion: None.   Consultants (if any):   Discharged Condition: Improved  Hospital Course: Kristin Stuart is an 68 y.o. female who was admitted 12/20/2018 with a diagnosis of primary osteoarthritis of the right shoulder with significant rotator cuff tendinopathy and went to the operating room on 12/20/2018 and underwent the above named procedures.    Surgeries: Procedure(s): REVERSE SHOULDER ARTHROPLASTY on 12/20/2018 Patient tolerated the surgery well. Taken to PACU where she was stabilized and then transferred to the orthopedic floor.  Started on Lovenox 40mg  q 24 hrs. Foot pumps applied bilaterally at 80 mm. Heels elevated on bed with rolled towels. No evidence of DVT. Negative Homan. Physical therapy started on day #1 for gait training and transfer. OT started day #1 for ADL and assisted  devices.  Patient's IV was removed on POD2.  Implants: All press-fit [employer] Comprehensive system with a #11 micro-humeral stem, a 40 mm +6 mm offset humeral tray with a standard insert, and a mini-base plate with a 36 mm glenosphere.  She was given perioperative antibiotics:  Anti-infectives (From admission, onward)   Start     Dose/Rate Route Frequency Ordered Stop   12/20/18 2100  vancomycin (VANCOCIN) IVPB 1000 mg/200 mL premix     1,000 mg 200 mL/hr over 60 Minutes Intravenous Every 12 hours 12/20/18 1448 12/21/18 0859   12/20/18 2100  ceFAZolin (ANCEF) IVPB 1 g/50 mL premix     1 g 100 mL/hr over 30 Minutes Intravenous Every 6 hours 12/20/18 2033 12/21/18 1424   12/20/18 0845  vancomycin (VANCOCIN) IVPB 1000 mg/200 mL premix     1,000 mg 200 mL/hr over 60 Minutes Intravenous  Once 12/20/18 0834 12/20/18 1021    .  She was given sequential compression devices, early ambulation, and lovenox for DVT prophylaxis.  She benefited maximally from the hospital stay and there were no complications.    Recent vital signs:  Vitals:   12/22/18 0751 12/22/18 1106  BP: (!) 147/90 (!) 148/86  Pulse: 78 82  Resp: 18   Temp: 97.9 F (36.6 C)   SpO2: 96%     Recent laboratory studies:  Lab Results  Component Value Date   HGB 10.8 (L) 12/21/2018   HGB 12.0 12/13/2018   HGB 14.9 10/28/2018   Lab Results  Component Value Date   WBC 6.9 12/21/2018   PLT 223 12/21/2018  Lab Results  Component Value Date   INR 0.9 04/07/2013   Lab Results  Component Value Date   NA 138 12/22/2018   K 3.6 12/22/2018   CL 103 12/22/2018   CO2 28 12/22/2018   BUN 12 12/22/2018   CREATININE 0.74 12/22/2018   GLUCOSE 102 (H) 12/22/2018    Discharge Medications:   Allergies as of 12/22/2018      Reactions   Penicillins Other (See Comments)   Vaginal infection Has patient had a PCN reaction causing immediate rash, facial/tongue/throat swelling, SOB or lightheadedness with hypotension: No Has  patient had a PCN reaction causing severe rash involving mucus membranes or skin necrosis: No Has patient had a PCN reaction that required hospitalization No Has patient had a PCN reaction occurring within the last 10 years: Unknown If all of the above answers are "NO", then may proceed with Cephalosporin use. Vaginal infection   Norvasc [amlodipine] Swelling, Other (See Comments)   Ankle swelling   Vancomycin Rash      Medication List    STOP taking these medications   acetaminophen-codeine 300-30 MG tablet Commonly known as:  TYLENOL #3   aspirin 81 MG tablet Replaced by:  aspirin EC 325 MG tablet     TAKE these medications   amLODipine 2.5 MG tablet Commonly known as:  NORVASC Take 2.5 mg by mouth daily.   aspirin EC 325 MG tablet Take 1 tablet (325 mg total) by mouth daily. Replaces:  aspirin 81 MG tablet   carboxymethylcellulose 0.5 % Soln Commonly known as:  REFRESH PLUS 1 drop 3 (three) times daily as needed.   cyclobenzaprine 10 MG tablet Commonly known as:  FLEXERIL Take 20 mg by mouth at bedtime.   hydrochlorothiazide 25 MG tablet Commonly known as:  HYDRODIURIL Take 25 mg by mouth daily.   levothyroxine 125 MCG tablet Commonly known as:  SYNTHROID, LEVOTHROID Take 125 mcg by mouth every Wednesday.   levothyroxine 112 MCG tablet Commonly known as:  SYNTHROID, LEVOTHROID Take 112 mcg by mouth See admin instructions. Take 1 tablet (112mcg) by mouth every Monday, Tuesday, Thursday, Friday, Saturday and Sunday morning   meloxicam 15 MG tablet Commonly known as:  MOBIC Take 15 mg by mouth daily.   metoprolol succinate 50 MG 24 hr tablet Commonly known as:  TOPROL-XL Take 50 mg by mouth daily. Take with or immediately following a meal.   Oxcarbazepine 300 MG tablet Commonly known as:  TRILEPTAL Take 300 mg by mouth 2 (two) times daily.   oxyCODONE 5 MG immediate release tablet Commonly known as:  Oxy IR/ROXICODONE Take 1-2 tablets (5-10 mg total) by  mouth every 4 (four) hours as needed for moderate pain (pain score 4-6).   simvastatin 20 MG tablet Commonly known as:  ZOCOR Take 20 mg by mouth at bedtime.   traMADol 50 MG tablet Commonly known as:  ULTRAM Take 1 tablet (50 mg total) by mouth every 6 (six) hours.       Diagnostic Studies: Dg Shoulder Right Port  Result Date: 12/20/2018 CLINICAL DATA:  68 year old female for right shoulder replacement. Initial encounter. EXAM: PORTABLE RIGHT SHOULDER COMPARISON:  10/28/2018 chest x-ray. FINDINGS: Post reversed total right shoulder replacement which appears in satisfactory position. Inferior glenoid screw incompletely assessed on images submitted. Right base subsegmental atelectasis. IMPRESSION: Post total right shoulder replacement as noted above. Electronically Signed   By: Lacy DuverneySteven  Olson M.D.   On: 12/20/2018 13:42   Disposition: Plan for discharge home on 12/22/18.  Follow-up  Information    Anson Oregon, PA-C Follow up in 14 day(s).   Specialty:  Physician Assistant Why:  Mindi Slicker information: [address] [place] [address] [phone]          Signed: Meriel Pica PA-C 12/22/2018, 12:43 PM

## 2018-12-22 NOTE — Progress Notes (Signed)
Physical Therapy Treatment Patient Details Name: Kristin Stuart MRN: [id number] DOB: 1951-01-09 Today's Date: 12/22/2018    History of Present Illness 68 yo female s/p R reverse TSA on 12/20/2018. PMHx includes HTN, OA, fibromyalgia, Raynauds, hiatal hernia, GERD, hypothyroidism, TIA (2012).    PT Comments    Pt reports ongoing pain but agreed on second attempt. Stood with supervision and was able to ambulate unit x 1 and complete stair training.  She had no difficulty with stairs.  Pain remains limiting factor at this time.  Took occasionally standing rest breaks with gait due to pain.  Voices no concerns with mobility.    Follow Up Recommendations  Follow surgeon's recommendation for DC plan and follow-up therapies     Equipment Recommendations  None recommended by PT    Recommendations for Other Services       Precautions / Restrictions Precautions Precautions: Shoulder Shoulder Interventions: Shoulder sling/immobilizer;At all times;Off for dressing/bathing/exercises Restrictions Weight Bearing Restrictions: Yes RUE Weight Bearing: Non weight bearing    Mobility  Bed Mobility               General bed mobility comments: to recliner with nurse tech  Transfers Overall transfer level: Needs assistance Equipment used: None Transfers: Sit to/from Stand Sit to Stand: Supervision            Ambulation/Gait   Gait Distance (Feet): 190 Feet Assistive device: None Gait Pattern/deviations: Step-through pattern;Decreased step length - right;Decreased step length - left Gait velocity: decreased       Stairs Stairs: Yes Stairs assistance: Supervision Stair Management: One rail Left;One rail Right Number of Stairs: 4 General stair comments: has full flight at home to access bedroom.  has rails on both sides and was able to go up/down confidently.  declined practice of full flight.   Wheelchair Mobility    Modified Rankin (Stroke Patients Only)        Balance Overall balance assessment: Needs assistance Sitting-balance support: Feet supported;No upper extremity supported Sitting balance-Leahy Scale: Good     Standing balance support: No upper extremity supported;During functional activity Standing balance-Leahy Scale: Good Standing balance comment: steady with ambulation (no UE support)                            Cognition Arousal/Alertness: Awake/alert Behavior During Therapy: WFL for tasks assessed/performed Overall Cognitive Status: Within Functional Limits for tasks assessed                                        Exercises      General Comments        Pertinent Vitals/Pain Pain Assessment: 0-10 Pain Score: 8  Pain Location: R shoulder Pain Descriptors / Indicators: Sore;Aching;Operative site guarding Pain Intervention(s): Limited activity within patient's tolerance;Monitored during session;Premedicated before session;Repositioned    Home Living                      Prior Function            PT Goals (current goals can now be found in the care plan section) Progress towards PT goals: Progressing toward goals    Frequency    BID      PT Plan Current plan remains appropriate    Co-evaluation              AM-PAC PT "  6 Clicks" Mobility   Outcome Measure  Help needed turning from your back to your side while in a flat bed without using bedrails?: None Help needed moving from lying on your back to sitting on the side of a flat bed without using bedrails?: None Help needed moving to and from a bed to a chair (including a wheelchair)?: None Help needed standing up from a chair using your arms (e.g., wheelchair or bedside chair)?: None Help needed to walk in hospital room?: A Little Help needed climbing 3-5 steps with a railing? : A Little 6 Click Score: 22    End of Session Equipment Utilized During Treatment: Gait belt;Other (comment) Activity Tolerance:  Patient limited by pain Patient left: in chair;with call bell/phone within reach;with chair alarm set   Pain - Right/Left: Right Pain - part of body: Shoulder     Time: 3383-2919 PT Time Calculation (min) (ACUTE ONLY): 15 min  Charges:  $Gait Training: 8-22 mins                     Danielle Dess, PTA 12/22/18, 10:31 AM

## 2018-12-22 NOTE — Progress Notes (Signed)
OT Cancellation Note  Patient Details Name: Kristin Stuart MRN: [id number] DOB: 09/27/1951   Cancelled Treatment:    Reason Eval/Treat Not Completed: Other (comment). On 2nd attempt this am, pt in the bathroom "working on a BM." Pt agreeable to OT coming back this pm for OT treatment.  Richrd Prime, MPH, MS, OTR/L ascom [phone] 12/22/18, 11:51 AM

## 2019-01-02 NOTE — Addendum Note (Signed)
Addendum  created 01/02/19 2025 by Yves Dill, MD   Clinical Note Signed

## 2019-03-28 ENCOUNTER — Emergency Department
Admission: EM | Admit: 2019-03-28 | Discharge: 2019-03-28 | Disposition: A | Discharge status: Home / Self Care | Payer: Medicare Other | Attending: Emergency Medicine | Admitting: Emergency Medicine

## 2019-03-28 ENCOUNTER — Other Ambulatory Visit: Payer: Self-pay

## 2019-03-28 DIAGNOSIS — Z96611 Presence of right artificial shoulder joint: Secondary | ICD-10-CM | POA: Insufficient documentation

## 2019-03-28 DIAGNOSIS — F22 Delusional disorders: Secondary | ICD-10-CM | POA: Diagnosis not present

## 2019-03-28 DIAGNOSIS — T7621XA Adult sexual abuse, suspected, initial encounter: Secondary | ICD-10-CM | POA: Diagnosis present

## 2019-03-28 DIAGNOSIS — Z79899 Other long term (current) drug therapy: Secondary | ICD-10-CM | POA: Insufficient documentation

## 2019-03-28 DIAGNOSIS — Z7982 Long term (current) use of aspirin: Secondary | ICD-10-CM | POA: Diagnosis not present

## 2019-03-28 DIAGNOSIS — Z87891 Personal history of nicotine dependence: Secondary | ICD-10-CM | POA: Diagnosis not present

## 2019-03-28 DIAGNOSIS — I10 Essential (primary) hypertension: Secondary | ICD-10-CM | POA: Diagnosis not present

## 2019-03-28 DIAGNOSIS — Z8673 Personal history of transient ischemic attack (TIA), and cerebral infarction without residual deficits: Secondary | ICD-10-CM | POA: Diagnosis not present

## 2019-03-28 DIAGNOSIS — E039 Hypothyroidism, unspecified: Secondary | ICD-10-CM | POA: Insufficient documentation

## 2019-03-28 DIAGNOSIS — T7421XA Adult sexual abuse, confirmed, initial encounter: Secondary | ICD-10-CM | POA: Diagnosis not present

## 2019-03-28 DIAGNOSIS — F419 Anxiety disorder, unspecified: Secondary | ICD-10-CM | POA: Insufficient documentation

## 2019-03-28 MED ORDER — METRONIDAZOLE 500 MG PO TABS
2000.0000 mg | ORAL_TABLET | Freq: Once | ORAL | Status: DC
  Filled 2019-03-28: qty 4

## 2019-03-28 MED ORDER — LIDOCAINE HCL (PF) 1 % IJ SOLN
0.9000 mL | Freq: Once | INTRAMUSCULAR | Status: DC
  Filled 2019-03-28: qty 5

## 2019-03-28 MED ORDER — CEFTRIAXONE SODIUM 250 MG IJ SOLR
250.0000 mg | Freq: Once | INTRAMUSCULAR | Status: DC
  Filled 2019-03-28: qty 250

## 2019-03-28 MED ORDER — AZITHROMYCIN 500 MG PO TABS
1000.0000 mg | ORAL_TABLET | Freq: Once | ORAL | Status: DC
  Filled 2019-03-28: qty 2

## 2019-03-28 NOTE — ED Notes (Signed)
SANE RN paged. Patient given box of tissues and warm blanket.
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
## 2019-03-28 NOTE — ED Notes (Addendum)
Patient became tearful during conversation with Dr. Lenard Lance, but recovered and is now calm, pleasant. Patient showed Dr. Lenard Lance and this RN pictures of what she says is dog feces on her porch, the handle of her car and the windshield of her car. Patient is dressed well with good hygiene. Patient was given a remote for the TV, but states she has her cell phone in her purse with games on it. Patient was agreeable to having the SANE RN called for exam.
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
## 2019-03-28 NOTE — ED Notes (Signed)
Psychiatrist and PD at bedside to speak with pt

## 2019-03-28 NOTE — ED Notes (Signed)
Dr. Lenard Lance with patient.
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
## 2019-03-28 NOTE — ED Notes (Signed)
Report given to Dawn RN

## 2019-03-28 NOTE — ED Notes (Signed)
Pt has meds from home for chronic pain management, okay for pt to take per MD [facility]
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
## 2019-06-21 ENCOUNTER — Emergency Department
Admission: EM | Admit: 2019-06-21 | Discharge: 2019-06-22 | Disposition: A | Discharge status: Home / Self Care | Payer: Medicare Other | Attending: Emergency Medicine | Admitting: Emergency Medicine

## 2019-06-21 ENCOUNTER — Ambulatory Visit
Admission: EM | Admit: 2019-06-21 | Discharge: 2019-06-21 | Disposition: A | Discharge status: Home / Self Care | Payer: No Typology Code available for payment source | Source: Ambulatory Visit | Attending: Emergency Medicine | Admitting: Emergency Medicine

## 2019-06-21 ENCOUNTER — Encounter: Payer: Self-pay | Admitting: Emergency Medicine

## 2019-06-21 ENCOUNTER — Emergency Department: Payer: Medicare Other

## 2019-06-21 ENCOUNTER — Other Ambulatory Visit: Payer: Self-pay

## 2019-06-21 DIAGNOSIS — Z79899 Other long term (current) drug therapy: Secondary | ICD-10-CM | POA: Diagnosis not present

## 2019-06-21 DIAGNOSIS — F22 Delusional disorders: Secondary | ICD-10-CM | POA: Diagnosis present

## 2019-06-21 DIAGNOSIS — Z7982 Long term (current) use of aspirin: Secondary | ICD-10-CM | POA: Diagnosis not present

## 2019-06-21 DIAGNOSIS — T7421XA Adult sexual abuse, confirmed, initial encounter: Secondary | ICD-10-CM

## 2019-06-21 DIAGNOSIS — F600 Paranoid personality disorder: Secondary | ICD-10-CM | POA: Insufficient documentation

## 2019-06-21 DIAGNOSIS — R102 Pelvic and perineal pain: Secondary | ICD-10-CM | POA: Diagnosis not present

## 2019-06-21 DIAGNOSIS — Z711 Person with feared health complaint in whom no diagnosis is made: Secondary | ICD-10-CM | POA: Insufficient documentation

## 2019-06-21 DIAGNOSIS — E039 Hypothyroidism, unspecified: Secondary | ICD-10-CM | POA: Diagnosis not present

## 2019-06-21 DIAGNOSIS — M5136 Other intervertebral disc degeneration, lumbar region: Secondary | ICD-10-CM | POA: Diagnosis present

## 2019-06-21 DIAGNOSIS — R109 Unspecified abdominal pain: Secondary | ICD-10-CM | POA: Diagnosis not present

## 2019-06-21 DIAGNOSIS — Z96611 Presence of right artificial shoulder joint: Secondary | ICD-10-CM | POA: Diagnosis not present

## 2019-06-21 DIAGNOSIS — Z0441 Encounter for examination and observation following alleged adult rape: Secondary | ICD-10-CM | POA: Insufficient documentation

## 2019-06-21 DIAGNOSIS — F1110 Opioid abuse, uncomplicated: Secondary | ICD-10-CM | POA: Diagnosis not present

## 2019-06-21 DIAGNOSIS — M79652 Pain in left thigh: Secondary | ICD-10-CM | POA: Insufficient documentation

## 2019-06-21 DIAGNOSIS — R413 Other amnesia: Secondary | ICD-10-CM | POA: Diagnosis not present

## 2019-06-21 DIAGNOSIS — Z87891 Personal history of nicotine dependence: Secondary | ICD-10-CM | POA: Diagnosis not present

## 2019-06-21 DIAGNOSIS — M5481 Occipital neuralgia: Secondary | ICD-10-CM | POA: Diagnosis present

## 2019-06-21 DIAGNOSIS — I10 Essential (primary) hypertension: Secondary | ICD-10-CM | POA: Diagnosis not present

## 2019-06-21 DIAGNOSIS — Z8673 Personal history of transient ischemic attack (TIA), and cerebral infarction without residual deficits: Secondary | ICD-10-CM | POA: Insufficient documentation

## 2019-06-21 DIAGNOSIS — M5030 Other cervical disc degeneration, unspecified cervical region: Secondary | ICD-10-CM | POA: Diagnosis present

## 2019-06-21 DIAGNOSIS — M47812 Spondylosis without myelopathy or radiculopathy, cervical region: Secondary | ICD-10-CM | POA: Diagnosis present

## 2019-06-21 LAB — CBC WITH DIFFERENTIAL/PLATELET
Abs Immature Granulocytes: 0.01 10*3/uL (ref 0.00–0.07)
Basophils Absolute: 0 10*3/uL (ref 0.0–0.1)
Basophils Relative: 0 %
Eosinophils Absolute: 0.1 10*3/uL (ref 0.0–0.5)
Eosinophils Relative: 4 %
HCT: 36.3 % (ref 36.0–46.0)
Hemoglobin: 12.8 g/dL (ref 12.0–15.0)
Immature Granulocytes: 0 %
Lymphocytes Relative: 38 %
Lymphs Abs: 1.3 10*3/uL (ref 0.7–4.0)
MCH: 29.5 pg (ref 26.0–34.0)
MCHC: 35.3 g/dL (ref 30.0–36.0)
MCV: 83.6 fL (ref 80.0–100.0)
Monocytes Absolute: 0.2 10*3/uL (ref 0.1–1.0)
Monocytes Relative: 7 %
Neutro Abs: 1.8 10*3/uL (ref 1.7–7.7)
Neutrophils Relative %: 51 %
Platelets: 251 10*3/uL (ref 150–400)
RBC: 4.34 MIL/uL (ref 3.87–5.11)
RDW: 11.7 % (ref 11.5–15.5)
WBC: 3.5 10*3/uL — ABNORMAL LOW (ref 4.0–10.5)
nRBC: 0 % (ref 0.0–0.2)

## 2019-06-21 LAB — COMPREHENSIVE METABOLIC PANEL
ALT: 20 U/L (ref 0–44)
AST: 29 U/L (ref 15–41)
Albumin: 4.5 g/dL (ref 3.5–5.0)
Alkaline Phosphatase: 76 U/L (ref 38–126)
Anion gap: 9 (ref 5–15)
BUN: 14 mg/dL (ref 8–23)
CO2: 26 mmol/L (ref 22–32)
Calcium: 8.6 mg/dL — ABNORMAL LOW (ref 8.9–10.3)
Chloride: 95 mmol/L — ABNORMAL LOW (ref 98–111)
Creatinine, Ser: 0.7 mg/dL (ref 0.44–1.00)
GFR calc Af Amer: >60 mL/min (ref >60)
GFR calc non Af Amer: >60 mL/min (ref >60)
Glucose, Bld: 101 mg/dL — ABNORMAL HIGH (ref 70–99)
Potassium: 3.7 mmol/L (ref 3.5–5.1)
Sodium: 130 mmol/L — ABNORMAL LOW (ref 135–145)
Total Bilirubin: 0.7 mg/dL (ref 0.3–1.2)
Total Protein: 7.5 g/dL (ref 6.5–8.1)

## 2019-06-21 LAB — URINALYSIS, COMPLETE (UACMP) WITH MICROSCOPIC
Bilirubin Urine: NEGATIVE
Glucose, UA: NEGATIVE mg/dL
Hgb urine dipstick: NEGATIVE
Ketones, ur: NEGATIVE mg/dL
Leukocytes,Ua: NEGATIVE
Nitrite: NEGATIVE
Protein, ur: NEGATIVE mg/dL
Specific Gravity, Urine: 1.009 (ref 1.005–1.030)
Squamous Epithelial / HPF: NONE SEEN (ref 0–5)
pH: 7 (ref 5.0–8.0)

## 2019-06-21 LAB — URINE DRUG SCREEN, QUALITATIVE (ARMC ONLY)
Amphetamines, Ur Screen: NOT DETECTED
Barbiturates, Ur Screen: NOT DETECTED
Benzodiazepine, Ur Scrn: NOT DETECTED
Cannabinoid 50 Ng, Ur ~~LOC~~: NOT DETECTED
Cocaine Metabolite,Ur ~~LOC~~: NOT DETECTED
MDMA (Ecstasy)Ur Screen: NOT DETECTED
Methadone Scn, Ur: NOT DETECTED
Opiate, Ur Screen: POSITIVE — AB
Phencyclidine (PCP) Ur S: NOT DETECTED
Tricyclic, Ur Screen: POSITIVE — AB

## 2019-06-21 LAB — LIPASE, BLOOD: Lipase: 21 U/L (ref 11–51)

## 2019-06-21 LAB — SALICYLATE LEVEL: Salicylate Lvl: <7 mg/dL (ref 2.8–30.0)

## 2019-06-21 LAB — ACETAMINOPHEN LEVEL: Acetaminophen (Tylenol), Serum: <10 ug/mL — ABNORMAL LOW (ref 10–30)

## 2019-06-21 LAB — ETHANOL: Alcohol, Ethyl (B): <10 mg/dL (ref <10)

## 2019-06-21 NOTE — ED Notes (Signed)
Pt to CT at this time.
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
## 2019-06-22 LAB — URINE CULTURE: Culture: <10000 — AB

## 2019-06-22 MED ORDER — METRONIDAZOLE 500 MG PO TABS
ORAL_TABLET | ORAL | Status: AC
  Filled 2019-06-22: qty 2

## 2019-06-22 MED ORDER — CEFTRIAXONE SODIUM 250 MG IJ SOLR
INTRAMUSCULAR | Status: AC
  Filled 2019-06-22: qty 250

## 2019-06-22 MED ORDER — AZITHROMYCIN 500 MG PO TABS
ORAL_TABLET | ORAL | Status: AC
  Filled 2019-06-22: qty 2
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
## 2019-07-20 ENCOUNTER — Other Ambulatory Visit: Payer: Self-pay

## 2019-08-01 ENCOUNTER — Emergency Department: Payer: Medicare Other

## 2019-08-01 ENCOUNTER — Encounter: Payer: Self-pay | Admitting: Emergency Medicine

## 2019-08-01 ENCOUNTER — Other Ambulatory Visit: Payer: Self-pay

## 2019-08-01 ENCOUNTER — Emergency Department
Admission: EM | Admit: 2019-08-01 | Discharge: 2019-08-01 | Disposition: A | Discharge status: Home / Self Care | Payer: Medicare Other | Attending: Emergency Medicine | Admitting: Emergency Medicine

## 2019-08-01 DIAGNOSIS — Z87891 Personal history of nicotine dependence: Secondary | ICD-10-CM | POA: Insufficient documentation

## 2019-08-01 DIAGNOSIS — R110 Nausea: Secondary | ICD-10-CM

## 2019-08-01 DIAGNOSIS — Z8673 Personal history of transient ischemic attack (TIA), and cerebral infarction without residual deficits: Secondary | ICD-10-CM | POA: Insufficient documentation

## 2019-08-01 DIAGNOSIS — B349 Viral infection, unspecified: Secondary | ICD-10-CM

## 2019-08-01 DIAGNOSIS — I10 Essential (primary) hypertension: Secondary | ICD-10-CM | POA: Diagnosis not present

## 2019-08-01 DIAGNOSIS — E039 Hypothyroidism, unspecified: Secondary | ICD-10-CM | POA: Insufficient documentation

## 2019-08-01 DIAGNOSIS — R509 Fever, unspecified: Secondary | ICD-10-CM | POA: Diagnosis present

## 2019-08-01 DIAGNOSIS — Z79899 Other long term (current) drug therapy: Secondary | ICD-10-CM | POA: Diagnosis not present

## 2019-08-01 DIAGNOSIS — Z7982 Long term (current) use of aspirin: Secondary | ICD-10-CM | POA: Insufficient documentation

## 2019-08-01 DIAGNOSIS — Z20828 Contact with and (suspected) exposure to other viral communicable diseases: Secondary | ICD-10-CM | POA: Diagnosis not present

## 2019-08-01 MED ORDER — SODIUM CHLORIDE 0.9 % IV BOLUS
500.0000 mL | Freq: Once | INTRAVENOUS | Status: AC
  Administered 2019-08-01: 15:00:00 500 mL via INTRAVENOUS

## 2019-08-01 MED ORDER — ONDANSETRON HCL 8 MG PO TABS: 8.0000 mg | ORAL_TABLET | Freq: Three times a day (TID) | ORAL | 0 refills | Status: DC | PRN

## 2019-08-01 MED ORDER — ONDANSETRON 8 MG PO TBDP
8.0000 mg | ORAL_TABLET | Freq: Once | ORAL | Status: AC
  Administered 2019-08-01: 13:00:00 8 mg via ORAL
  Filled 2019-08-01: qty 1

## 2019-08-01 MED ORDER — ONDANSETRON HCL 4 MG/2ML IJ SOLN
4.0000 mg | Freq: Once | INTRAMUSCULAR | Status: AC
  Administered 2019-08-01: 15:00:00 4 mg via INTRAVENOUS
  Filled 2019-08-01: qty 2

## 2019-08-01 MED ORDER — ACETAMINOPHEN 325 MG PO TABS
650.0000 mg | ORAL_TABLET | Freq: Once | ORAL | Status: AC
  Administered 2019-08-01: 15:00:00 650 mg via ORAL
  Filled 2019-08-01: qty 2

## 2019-08-01 NOTE — ED Notes (Signed)
See triage note   States she developed fever yesterday but is afebrile on arrival   Has had some nausea   Also states decrease taste for 2 weeks

## 2019-08-01 NOTE — ED Provider Notes (Signed)
[facility] Emergency Department Provider Note   ____________________________________________   First MD Initiated Contact with Patient 08/01/19 1311     (approximate)  I have reviewed the triage vital signs and the nursing notes.   HISTORY  Chief Complaint Fever    HPI Kristin Stuart is a 68 y.o. female patient presents with low-grade fever and nausea started today.  Patient over the past 2 weeks she does not have a sense of taste.  Patient also complained of decreased appetite.  Patient denies URI signs and symptoms.  Patient stated no vomiting or diarrhea.  Patient denies recent travel or known contact with COVID-19.  Patient  called her PCP today was told to come to emergency room for definitive evaluation.  No palliative measures for complaint.      Past Medical History:  Diagnosis Date  . Anemia   . Arthritis   . Edema    LEGS/FEET  . Edema    legs/feet  . Fibromyalgia   . GERD (gastroesophageal reflux disease)   . History of hiatal hernia   . Hypercholesteremia   . Hypertension   . Hypothyroid   . PONV (postoperative nausea and vomiting)   . Post laminectomy syndrome   . Raynaud's syndrome   . Stroke ([place])    tia  . TIA (transient ischemic attack) 2012    Patient Active Problem List   Diagnosis Date Noted  . Paranoia ([place]) 03/28/2019  . 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 Bilateral occipital neuralgia 03/14/2015  . DDD (degenerative disc disease), lumbar 03/14/2015  . Facet syndrome, lumbar 03/14/2015  . DDD (degenerative disc disease), cervical 03/14/2015  . Cervical facet joint syndrome 03/14/2015  . Migraine 03/14/2015    Past Surgical History:  Procedure Laterality Date  . ABDOMINAL HYSTERECTOMY  1975  . ANTERIOR CERVICAL DECOMP/DISCECTOMY FUSION  1993, 2011   C5-C6, C3-C4  . BACK SURGERY     x4  . BLADDER SUSPENSION  2010  . BREAST CYST EXCISION Right 1981   neg  . BREAST  EXCISIONAL BIOPSY Left 1980   NEG  . BREAST LUMPECTOMY Left 1980   benign  . CATARACT EXTRACTION W/PHACO Right 06/25/2015   Procedure: CATARACT EXTRACTION PHACO AND INTRAOCULAR LENS PLACEMENT ([place]);  Surgeon: Birder Robson, MD;  Location: [facility] ORS;  Service: Ophthalmology;  Laterality: Right;  US:00:54.9 AP:25.9 CDE:14.22 LOT PAK #[id number]  . CATARACT EXTRACTION W/PHACO Left 07/16/2015   Procedure: CATARACT EXTRACTION PHACO AND INTRAOCULAR LENS PLACEMENT (IOC);  Surgeon: Birder Robson, MD;  Location: [facility] ORS;  Service: Ophthalmology;  Laterality: Left;  [place]: 00:21.5 AP%: 20.1 CDE: 4.31 Lot # [id number]  . EXCISIONAL HEMORRHOIDECTOMY  2014  . JOINT REPLACEMENT  12/20/2018   right shoulder  . LUMBAR DISC SURGERY  1999   L4-L5  . POLYPECTOMY  20014   3 polyps removed from large intestines  . REVERSE SHOULDER ARTHROPLASTY Right 12/20/2018   Procedure: REVERSE SHOULDER ARTHROPLASTY;  Surgeon: Corky Mull, MD;  Location: [facility] ORS;  Service: Orthopedics;  Laterality: Right;  . TUBAL LIGATION  1974    Prior to Admission medications   Medication Sig Start Date End Date Taking? Authorizing Provider  amLODipine (NORVASC) 2.5 MG tablet Take 2.5 mg by mouth daily.    [provider]  aspirin EC 325 MG tablet Take 1 tablet (325 mg total) by mouth daily. 12/21/18   Lattie Corns, PA-C  carboxymethylcellulose (REFRESH PLUS) 0.5 % SOLN 1 drop 3 (three) times daily  as needed.    [provider]  cyclobenzaprine (FLEXERIL) 10 MG tablet Take 20 mg by mouth at bedtime.     [provider]  hydrochlorothiazide (HYDRODIURIL) 25 MG tablet Take 25 mg by mouth daily.    [provider]  levothyroxine (SYNTHROID, LEVOTHROID) 112 MCG tablet Take 112 mcg by mouth See admin instructions. Take 1 tablet (112mcg) by mouth every Monday, Tuesday, Thursday, Friday, Saturday and Sunday morning    [provider]  levothyroxine (SYNTHROID, LEVOTHROID) 125 MCG tablet  Take 125 mcg by mouth every Wednesday.     [provider]  meloxicam (MOBIC) 15 MG tablet Take 15 mg by mouth daily.    [provider]  metoprolol succinate (TOPROL-XL) 50 MG 24 hr tablet Take 50 mg by mouth daily. Take with or immediately following a meal.    [provider]  ondansetron (ZOFRAN) 8 MG tablet Take 1 tablet (8 mg total) by mouth every 8 (eight) hours as needed for nausea or vomiting. 08/01/19   Joni ReiningSmith, Lariya Kinzie K, PA-C  Oxcarbazepine (TRILEPTAL) 300 MG tablet Take 300 mg by mouth 2 (two) times daily.    [provider]  oxyCODONE (OXY IR/ROXICODONE) 5 MG immediate release tablet Take 1-2 tablets (5-10 mg total) by mouth every 4 (four) hours as needed for moderate pain (pain score 4-6). 12/21/18   Anson OregonMcGhee, James Lance, PA-C  simvastatin (ZOCOR) 20 MG tablet Take 20 mg by mouth at bedtime. 03/23/16 12/13/18  [provider]  traMADol (ULTRAM) 50 MG tablet Take 1 tablet (50 mg total) by mouth every 6 (six) hours. 12/21/18   Anson OregonMcGhee, James Lance, PA-C    Allergies Penicillins, Norvasc [amlodipine], and Vancomycin  Family History  Problem Relation Age of Onset  . Kidney disease Mother   . Varicose Veins Mother   . Cancer Father   . COPD Father   . Breast cancer Neg Hx     Social History Social History   Tobacco Use  . Smoking status: Former Smoker    Quit date: 03/13/1998    Years since quitting: 21.4  . Smokeless tobacco: Never Used  Substance Use Topics  . Alcohol use: Yes    Alcohol/week: 0.0 standard drinks    Comment: occasionally  . Drug use: No    Review of Systems  Constitutional: No fever/chills Eyes: No visual changes. ENT: No sore throat. Cardiovascular: Denies chest pain. Respiratory: Denies shortness of breath. Gastrointestinal: No abdominal pain.  Nausea, no vomiting.  No diarrhea.  No constipation. Genitourinary: Negative for dysuria. Musculoskeletal: Negative for back pain. Skin: Negative for rash.  Neurological: Positive for headaches, but denies focal weakness or numbness. Endocrine:  Hyperlipidemia, hypertension, and hypothyroidism. Allergic/Immunilogical: Penicillin, Norvasc, and vancomycin. ____________________________________________   PHYSICAL EXAM:  VITAL SIGNS: ED Triage Vitals  Enc Vitals Group     BP 08/01/19 1302 (!) 146/88     Pulse Rate 08/01/19 1302 84     Resp 08/01/19 1302 16     Temp 08/01/19 1302 98.5 F (36.9 C)     Temp Source 08/01/19 1302 Oral     SpO2 08/01/19 1302 98 %     Weight 08/01/19 1303 157 lb (71.2 kg)     Height 08/01/19 1303 5\' 5"  (1.651 m)     Head Circumference --      Peak Flow --      Pain Score 08/01/19 1303 0     Pain Loc --      Pain Edu? --  Excl. in GC? --    Constitutional: Alert and oriented. Well appearing and in no acute distress. Nose: No congestion/rhinnorhea. Mouth/Throat: Mucous membranes are moist.  Oropharynx non-erythematous. Neck: No stridor.   Hematological/Lymphatic/Immunilogical: No cervical lymphadenopathy. Cardiovascular: Normal rate, regular rhythm. Grossly normal heart sounds.  Good peripheral circulation. Respiratory: Normal respiratory effort.  No retractions. Lungs CTAB. Gastrointestinal: Soft and nontender. No distention. No abdominal bruits. No CVA tenderness. Genitourinary: Deferred Neurologic:  Normal speech and language. No gross focal neurologic deficits are appreciated. No gait instability. Skin:  Skin is warm, dry and intact. No rash noted. Psychiatric: Mood and affect are normal. Speech and behavior are normal.  ____________________________________________   LABS (all labs ordered are listed, but only abnormal results are displayed)  Labs Reviewed  SARS CORONAVIRUS 2 (TAT 6-24 HRS)   ____________________________________________  EKG   ____________________________________________  RADIOLOGY  ED MD interpretation:    Official radiology report(s): Dg Chest Portable 1 View   Result Date: 08/01/2019 CLINICAL DATA:  Cough and fever with fatigue EXAM: PORTABLE CHEST 1 VIEW COMPARISON:  October 28, 2018 FINDINGS: There is mild atelectatic change in the left lower lung region. Lungs elsewhere are clear. Heart is upper normal in size with pulmonary vascularity normal. No adenopathy. There is evidence of total shoulder replacement on the right. IMPRESSION: Mild left lower lung region atelectasis. No edema or consolidation. Heart upper normal in size. Electronically Signed   By: Bretta Bang III M.D.   On: 08/01/2019 14:07    ____________________________________________   PROCEDURES  Procedure(s) performed (including Critical Care):  Procedures   ____________________________________________   INITIAL IMPRESSION / ASSESSMENT AND PLAN / ED COURSE  As part of my medical decision making, I reviewed the following data within the electronic MEDICAL RECORD NUMBER         Haleemah Buckalew was evaluated in Emergency Department on 08/01/2019 for the symptoms described in the history of present illness. She was evaluated in the context of the global COVID-19 pandemic, which necessitated consideration that the patient might be at risk for infection with the SARS-CoV-2 virus that causes COVID-19. Institutional protocols and algorithms that pertain to the evaluation of patients at risk for COVID-19 are in a state of rapid change based on information released by regulatory bodies including the CDC and federal and state organizations. These policies and algorithms were followed during the patient's care in the ED.  Patient presents with fever and nausea today.  Patient also state for the past 2 weeks she does not have a sense of taste.  Patient a decreased appetite but has not noticed any lost weight.  Discussed negative chest x-ray findings with patient.  Patient had a COVID 19 test with results pending.  Patient will be notified telephonically any positive results.  Patient given  discharge care instruction for viral illness and nausea.  Patient advised take medication as directed and self quarantine pending test results.      ____________________________________________   FINAL CLINICAL IMPRESSION(S) / ED DIAGNOSES  Final diagnoses:  Viral illness  Nausea     ED Discharge Orders         Ordered    ondansetron (ZOFRAN) 8 MG tablet  Every 8 hours PRN     08/01/19 1531           Note:  This document was prepared using Dragon voice recognition software and may include unintentional dictation errors.    Joni Reining, PA-C 08/01/19 1535    Minna Antis, MD 08/02/19 [phone]

## 2019-08-01 NOTE — ED Triage Notes (Signed)
Pt in via POV, reports low grade fever x one day, some nausea today as well.  Pt states, "For the past 2 weeks, food doesn't have any taste.  Vitals WDL; NAD noted at this time.

## 2019-08-01 NOTE — Discharge Instructions (Signed)
Advised self quarantine pending COVID-19 test results. °

## 2019-08-02 LAB — SARS CORONAVIRUS 2 (TAT 6-24 HRS): SARS Coronavirus 2: NEGATIVE

## 2019-09-07 ENCOUNTER — Other Ambulatory Visit: Payer: Self-pay

## 2019-09-07 DIAGNOSIS — Z20822 Contact with and (suspected) exposure to covid-19: Secondary | ICD-10-CM

## 2019-09-09 LAB — NOVEL CORONAVIRUS, NAA: SARS-CoV-2, NAA: NOT DETECTED

## 2019-11-16 ENCOUNTER — Other Ambulatory Visit: Payer: Self-pay | Admitting: Family Medicine

## 2019-11-16 DIAGNOSIS — Z1231 Encounter for screening mammogram for malignant neoplasm of breast: Secondary | ICD-10-CM

## 2019-11-22 ENCOUNTER — Other Ambulatory Visit: Payer: Self-pay | Admitting: Surgery

## 2019-11-22 DIAGNOSIS — M4722 Other spondylosis with radiculopathy, cervical region: Secondary | ICD-10-CM

## 2019-11-29 ENCOUNTER — Ambulatory Visit
Admission: RE | Admit: 2019-11-29 | Discharge: 2019-11-29 | Disposition: A | Discharge status: Home / Self Care | Payer: Medicare HMO | Source: Ambulatory Visit | Attending: Surgery | Admitting: Surgery

## 2019-11-29 ENCOUNTER — Other Ambulatory Visit: Payer: Self-pay

## 2019-11-29 DIAGNOSIS — M4722 Other spondylosis with radiculopathy, cervical region: Secondary | ICD-10-CM | POA: Diagnosis present

## 2019-12-08 ENCOUNTER — Other Ambulatory Visit: Payer: Self-pay | Admitting: Neurosurgery

## 2019-12-08 ENCOUNTER — Ambulatory Visit
Admission: RE | Admit: 2019-12-08 | Discharge: 2019-12-08 | Disposition: A | Discharge status: Home / Self Care | Payer: Medicare HMO | Source: Ambulatory Visit | Attending: Family Medicine | Admitting: Family Medicine

## 2019-12-08 DIAGNOSIS — M542 Cervicalgia: Secondary | ICD-10-CM

## 2019-12-08 DIAGNOSIS — Z1231 Encounter for screening mammogram for malignant neoplasm of breast: Secondary | ICD-10-CM | POA: Diagnosis present

## 2019-12-08 DIAGNOSIS — Z981 Arthrodesis status: Secondary | ICD-10-CM

## 2019-12-19 ENCOUNTER — Other Ambulatory Visit: Payer: Self-pay

## 2019-12-19 ENCOUNTER — Ambulatory Visit
Admission: RE | Admit: 2019-12-19 | Discharge: 2019-12-19 | Disposition: A | Discharge status: Home / Self Care | Payer: Medicare HMO | Source: Ambulatory Visit | Attending: Neurosurgery | Admitting: Neurosurgery

## 2019-12-19 DIAGNOSIS — Z981 Arthrodesis status: Secondary | ICD-10-CM | POA: Insufficient documentation

## 2019-12-19 DIAGNOSIS — M542 Cervicalgia: Secondary | ICD-10-CM | POA: Diagnosis present

## 2020-02-29 ENCOUNTER — Other Ambulatory Visit: Payer: Self-pay | Admitting: Surgery

## 2020-03-04 ENCOUNTER — Encounter
Admission: RE | Admit: 2020-03-04 | Discharge: 2020-03-04 | Disposition: A | Discharge status: Home / Self Care | Payer: Medicare HMO | Source: Ambulatory Visit | Attending: Surgery | Admitting: Surgery

## 2020-03-04 ENCOUNTER — Other Ambulatory Visit: Payer: Self-pay

## 2020-03-04 HISTORY — DX: Plantar fascial fibromatosis: M72.2

## 2020-03-04 NOTE — Patient Instructions (Addendum)
COVID TESTING Date: Tuesday, May 4 Testing site:  [facility] [address] Thru Hours:  0:09 am - 1:00 pm Once you are tested, you are asked to stay quarantined (avoiding public places) until after your surgery.   Your procedure is scheduled on: Thursday, May 6 Report to Day Surgery on the 2nd floor of the [facility]. To find out your arrival time, please call [phone] between 1PM - 3PM on: Wednesday, May 5  REMEMBER: Instructions that are not followed completely may result in serious medical risk, up to and including death; or upon the discretion of your surgeon and anesthesiologist your surgery may need to be rescheduled.  Do not eat food after midnight the night before surgery.  No gum chewing, lozengers or hard candies.  You may however, drink CLEAR liquids up to 2 hours before you are scheduled to arrive for your surgery. Do not drink anything within 2 hours of your scheduled arrival time.  Clear liquids include: - water  - apple juice without pulp - gatorade (not RED) - black coffee or tea (Do NOT add milk or creamers to the coffee or tea) Do NOT drink anything that is not on this list.  ENSURE PRE-SURGERY CARBOHYDRATE DRINK:  Complete drinking 2 hours prior to scheduled arrival time.  TAKE THESE MEDICATIONS THE MORNING OF SURGERY WITH A SIP OF WATER:  1.  Amlodipine 2.  Levothyroxine 3.  Metoprolol 4.  Oxcarbazepine (trileptal)  Stop aspirin, meloxicam, mobic, Anti-inflammatories (NSAIDS) such as Advil, Aleve, Ibuprofen, Motrin, Naproxen, Naprosyn and Aspirin based products such as Excedrin, Goodys Powder, BC Powder. (May take Tylenol or Acetaminophen if needed.)  Stop ANY OVER THE COUNTER supplements until after surgery.  No Alcohol for 24 hours before or after surgery.  Do not use any "recreational" drugs for at least a week prior to your surgery.  Please be advised that the combination of cocaine and anesthesia may have  negative outcomes, up to and including death. If you test positive for cocaine, your surgery will be cancelled.  On the morning of surgery brush your teeth with toothpaste and water, you may rinse your mouth with mouthwash if you wish. Do not swallow any toothpaste or mouthwash.  Do not wear jewelry, make-up, hairpins, clips or nail polish.  Do not wear lotions, powders, or perfumes.   Do not shave 48 hours prior to surgery.   Contact lenses, hearing aids and dentures may not be worn into surgery.  Do not bring valuables to the hospital. [facility] is not responsible for any missing/lost belongings or valuables.   Use CHG Soap as directed on instruction sheet.  Notify your doctor if there is any change in your medical condition (cold, fever, infection).  Wear comfortable clothing (specific to your surgery type) to the hospital.  Plan for stool softeners for home use; pain medications have a tendency to cause constipation. You can also help prevent constipation by eating foods high in fiber such as fruits and vegetables and drinking plenty of fluids as your diet allows.  After surgery, you can help prevent lung complications by doing breathing exercises.  Take deep breaths and cough every 1-2 hours. Your doctor may order a device called an Incentive Spirometer to help you take deep breaths.  If you are being discharged the day of surgery, you will not be allowed to drive home. You will need a responsible adult (18 years or older) to drive you home and stay with you that  night.   If you are taking public transportation, you will need to have a responsible adult (18 years or older) with you. Please confirm with your physician that it is acceptable to use public transportation.   Please call the Pre-admissions Testing Dept. at [phone] if you have any questions about these instructions.  Visitation Policy:  Patients undergoing a surgery or procedure may have one family member  or support person with them as long as that person is not COVID-19 positive or experiencing its symptoms.  That person may remain in the waiting area during the procedure.

## 2020-03-05 ENCOUNTER — Other Ambulatory Visit: Payer: Medicare HMO

## 2020-03-05 ENCOUNTER — Encounter
Admission: RE | Admit: 2020-03-05 | Discharge: 2020-03-05 | Disposition: A | Discharge status: Home / Self Care | Payer: Medicare HMO | Source: Ambulatory Visit | Attending: Emergency Medicine | Admitting: Emergency Medicine

## 2020-03-05 DIAGNOSIS — Z20822 Contact with and (suspected) exposure to covid-19: Secondary | ICD-10-CM | POA: Insufficient documentation

## 2020-03-05 DIAGNOSIS — Z01812 Encounter for preprocedural laboratory examination: Secondary | ICD-10-CM | POA: Diagnosis not present

## 2020-03-05 LAB — CBC
HCT: 35.1 % — ABNORMAL LOW (ref 36.0–46.0)
Hemoglobin: 12.1 g/dL (ref 12.0–15.0)
MCH: 29.7 pg (ref 26.0–34.0)
MCHC: 34.5 g/dL (ref 30.0–36.0)
MCV: 86.2 fL (ref 80.0–100.0)
Platelets: 279 10*3/uL (ref 150–400)
RBC: 4.07 MIL/uL (ref 3.87–5.11)
RDW: 11.1 % — ABNORMAL LOW (ref 11.5–15.5)
WBC: 3.5 10*3/uL — ABNORMAL LOW (ref 4.0–10.5)
nRBC: 0 % (ref 0.0–0.2)

## 2020-03-05 LAB — SARS CORONAVIRUS 2 (TAT 6-24 HRS): SARS Coronavirus 2: NEGATIVE

## 2020-03-07 ENCOUNTER — Encounter
Admission: RE | Disposition: A | Discharge status: Home / Self Care | Payer: Self-pay | Source: Home / Self Care | Attending: Surgery

## 2020-03-07 ENCOUNTER — Other Ambulatory Visit: Payer: Self-pay

## 2020-03-07 ENCOUNTER — Ambulatory Visit
Admission: RE | Admit: 2020-03-07 | Discharge: 2020-03-07 | Disposition: A | Discharge status: Home / Self Care | Payer: Medicare HMO | Attending: Surgery | Admitting: Surgery

## 2020-03-07 ENCOUNTER — Ambulatory Visit: Payer: Medicare HMO | Admitting: Certified Registered"

## 2020-03-07 DIAGNOSIS — Z8673 Personal history of transient ischemic attack (TIA), and cerebral infarction without residual deficits: Secondary | ICD-10-CM | POA: Insufficient documentation

## 2020-03-07 DIAGNOSIS — Z87891 Personal history of nicotine dependence: Secondary | ICD-10-CM | POA: Diagnosis not present

## 2020-03-07 DIAGNOSIS — Z79890 Hormone replacement therapy (postmenopausal): Secondary | ICD-10-CM | POA: Insufficient documentation

## 2020-03-07 DIAGNOSIS — G8929 Other chronic pain: Secondary | ICD-10-CM | POA: Insufficient documentation

## 2020-03-07 DIAGNOSIS — Z7982 Long term (current) use of aspirin: Secondary | ICD-10-CM | POA: Insufficient documentation

## 2020-03-07 DIAGNOSIS — G5602 Carpal tunnel syndrome, left upper limb: Secondary | ICD-10-CM | POA: Diagnosis present

## 2020-03-07 DIAGNOSIS — R9431 Abnormal electrocardiogram [ECG] [EKG]: Secondary | ICD-10-CM | POA: Diagnosis not present

## 2020-03-07 DIAGNOSIS — I10 Essential (primary) hypertension: Secondary | ICD-10-CM | POA: Diagnosis not present

## 2020-03-07 DIAGNOSIS — M797 Fibromyalgia: Secondary | ICD-10-CM | POA: Diagnosis not present

## 2020-03-07 DIAGNOSIS — E039 Hypothyroidism, unspecified: Secondary | ICD-10-CM | POA: Diagnosis not present

## 2020-03-07 DIAGNOSIS — Z79899 Other long term (current) drug therapy: Secondary | ICD-10-CM | POA: Diagnosis not present

## 2020-03-07 DIAGNOSIS — K219 Gastro-esophageal reflux disease without esophagitis: Secondary | ICD-10-CM | POA: Insufficient documentation

## 2020-03-07 DIAGNOSIS — E7800 Pure hypercholesterolemia, unspecified: Secondary | ICD-10-CM | POA: Insufficient documentation

## 2020-03-07 DIAGNOSIS — Z981 Arthrodesis status: Secondary | ICD-10-CM | POA: Diagnosis not present

## 2020-03-07 HISTORY — PX: CARPAL TUNNEL RELEASE: SHX101

## 2020-03-07 LAB — URINE DRUG SCREEN, QUALITATIVE (ARMC ONLY)
Amphetamines, Ur Screen: NOT DETECTED
Barbiturates, Ur Screen: NOT DETECTED
Benzodiazepine, Ur Scrn: NOT DETECTED
Cannabinoid 50 Ng, Ur ~~LOC~~: POSITIVE — AB
Cocaine Metabolite,Ur ~~LOC~~: NOT DETECTED
MDMA (Ecstasy)Ur Screen: NOT DETECTED
Methadone Scn, Ur: NOT DETECTED
Opiate, Ur Screen: POSITIVE — AB
Phencyclidine (PCP) Ur S: NOT DETECTED
Tricyclic, Ur Screen: NOT DETECTED

## 2020-03-07 SURGERY — RELEASE, CARPAL TUNNEL, ENDOSCOPIC
Anesthesia: General | Site: Wrist | Laterality: Left

## 2020-03-07 MED ORDER — HYDROCODONE-ACETAMINOPHEN 5-325 MG PO TABS: 1.0000 | ORAL_TABLET | Freq: Four times a day (QID) | ORAL | 0 refills | Status: DC | PRN

## 2020-03-07 MED ORDER — HYDROCODONE-ACETAMINOPHEN 5-325 MG PO TABS
1.0000 | ORAL_TABLET | ORAL | Status: DC | PRN
  Administered 2020-03-07: 1 via ORAL

## 2020-03-07 MED ORDER — ONDANSETRON HCL 4 MG/2ML IJ SOLN: 4.0000 mg | Freq: Four times a day (QID) | INTRAMUSCULAR | Status: DC | PRN

## 2020-03-07 MED ORDER — ONDANSETRON HCL 4 MG/2ML IJ SOLN
INTRAMUSCULAR | Status: DC | PRN
  Administered 2020-03-07: 4 mg via INTRAVENOUS

## 2020-03-07 MED ORDER — ACETAMINOPHEN 10 MG/ML IV SOLN
INTRAVENOUS | Status: DC | PRN
  Administered 2020-03-07: 1000 mg via INTRAVENOUS

## 2020-03-07 MED ORDER — ONDANSETRON HCL 4 MG/2ML IJ SOLN: 4.0000 mg | Freq: Once | INTRAMUSCULAR | Status: DC | PRN

## 2020-03-07 MED ORDER — BUPIVACAINE HCL (PF) 0.5 % IJ SOLN
INTRAMUSCULAR | Status: AC
  Filled 2020-03-07: qty 30

## 2020-03-07 MED ORDER — OXYCODONE HCL 5 MG/5ML PO SOLN: 5.0000 mg | Freq: Once | ORAL | Status: DC | PRN

## 2020-03-07 MED ORDER — FENTANYL CITRATE (PF) 100 MCG/2ML IJ SOLN
INTRAMUSCULAR | Status: AC
  Filled 2020-03-07: qty 2

## 2020-03-07 MED ORDER — SCOPOLAMINE 1 MG/3DAYS TD PT72
MEDICATED_PATCH | TRANSDERMAL | Status: AC
  Administered 2020-03-07: 12:00:00 1.5 mg via TRANSDERMAL
  Filled 2020-03-07: qty 1

## 2020-03-07 MED ORDER — CLINDAMYCIN PHOSPHATE 900 MG/50ML IV SOLN
INTRAVENOUS | Status: AC
  Filled 2020-03-07: qty 50

## 2020-03-07 MED ORDER — FENTANYL CITRATE (PF) 100 MCG/2ML IJ SOLN
INTRAMUSCULAR | Status: DC | PRN
  Administered 2020-03-07 (×2): 50 ug via INTRAVENOUS

## 2020-03-07 MED ORDER — LIDOCAINE HCL (PF) 2 % IJ SOLN
INTRAMUSCULAR | Status: AC
  Filled 2020-03-07: qty 5

## 2020-03-07 MED ORDER — ONDANSETRON HCL 4 MG PO TABS: 4.0000 mg | ORAL_TABLET | Freq: Four times a day (QID) | ORAL | Status: DC | PRN

## 2020-03-07 MED ORDER — FENTANYL CITRATE (PF) 100 MCG/2ML IJ SOLN
25.0000 ug | INTRAMUSCULAR | Status: DC | PRN
  Administered 2020-03-07: 25 ug via INTRAVENOUS

## 2020-03-07 MED ORDER — PROPOFOL 10 MG/ML IV BOLUS
INTRAVENOUS | Status: AC
  Filled 2020-03-07: qty 20

## 2020-03-07 MED ORDER — METOCLOPRAMIDE HCL 10 MG PO TABS: 5.0000 mg | ORAL_TABLET | Freq: Three times a day (TID) | ORAL | Status: DC | PRN

## 2020-03-07 MED ORDER — BUPIVACAINE HCL (PF) 0.5 % IJ SOLN
INTRAMUSCULAR | Status: DC | PRN
  Administered 2020-03-07: 10 mL

## 2020-03-07 MED ORDER — FAMOTIDINE 20 MG PO TABS
ORAL_TABLET | ORAL | Status: AC
  Administered 2020-03-07: 20 mg via ORAL
  Filled 2020-03-07: qty 1

## 2020-03-07 MED ORDER — LIDOCAINE HCL (CARDIAC) PF 100 MG/5ML IV SOSY
PREFILLED_SYRINGE | INTRAVENOUS | Status: DC | PRN
  Administered 2020-03-07: 60 mg via INTRAVENOUS

## 2020-03-07 MED ORDER — EPHEDRINE 5 MG/ML INJ
INTRAVENOUS | Status: AC
  Filled 2020-03-07: qty 10

## 2020-03-07 MED ORDER — ACETAMINOPHEN 10 MG/ML IV SOLN
INTRAVENOUS | Status: AC
  Filled 2020-03-07: qty 100

## 2020-03-07 MED ORDER — METOCLOPRAMIDE HCL 5 MG/ML IJ SOLN: 5.0000 mg | Freq: Three times a day (TID) | INTRAMUSCULAR | Status: DC | PRN

## 2020-03-07 MED ORDER — CLINDAMYCIN PHOSPHATE 900 MG/50ML IV SOLN
900.0000 mg | INTRAVENOUS | Status: AC
  Administered 2020-03-07: 13:00:00 900 mg via INTRAVENOUS

## 2020-03-07 MED ORDER — EPHEDRINE SULFATE 50 MG/ML IJ SOLN
INTRAMUSCULAR | Status: DC | PRN
  Administered 2020-03-07 (×3): 10 mg via INTRAVENOUS

## 2020-03-07 MED ORDER — SCOPOLAMINE 1 MG/3DAYS TD PT72: 1.0000 | MEDICATED_PATCH | Freq: Once | TRANSDERMAL | Status: DC

## 2020-03-07 MED ORDER — FAMOTIDINE 20 MG PO TABS: 20.0000 mg | ORAL_TABLET | Freq: Once | ORAL | Status: AC

## 2020-03-07 MED ORDER — HYDROCODONE-ACETAMINOPHEN 5-325 MG PO TABS
ORAL_TABLET | ORAL | Status: AC
  Filled 2020-03-07: qty 1

## 2020-03-07 MED ORDER — OXYCODONE HCL 5 MG PO TABS: 5.0000 mg | ORAL_TABLET | Freq: Once | ORAL | Status: DC | PRN

## 2020-03-07 MED ORDER — PROPOFOL 10 MG/ML IV BOLUS
INTRAVENOUS | Status: DC | PRN
  Administered 2020-03-07: 150 mg via INTRAVENOUS
  Administered 2020-03-07: 50 mg via INTRAVENOUS

## 2020-03-07 MED ORDER — FENTANYL CITRATE (PF) 100 MCG/2ML IJ SOLN
INTRAMUSCULAR | Status: AC
  Administered 2020-03-07: 14:00:00 25 ug via INTRAVENOUS
  Filled 2020-03-07: qty 2

## 2020-03-07 MED ORDER — LACTATED RINGERS IV SOLN: INTRAVENOUS | Status: DC

## 2020-03-07 MED ORDER — ONDANSETRON HCL 4 MG/2ML IJ SOLN
INTRAMUSCULAR | Status: AC
  Filled 2020-03-07: qty 2

## 2020-03-07 MED ORDER — DEXAMETHASONE SODIUM PHOSPHATE 10 MG/ML IJ SOLN
INTRAMUSCULAR | Status: DC | PRN
  Administered 2020-03-07: 4 mg via INTRAVENOUS

## 2020-03-07 MED ORDER — DEXAMETHASONE SODIUM PHOSPHATE 10 MG/ML IJ SOLN
INTRAMUSCULAR | Status: AC
  Filled 2020-03-07: qty 1

## 2020-03-07 MED ORDER — POTASSIUM CHLORIDE IN NACL 20-0.9 MEQ/L-% IV SOLN: INTRAVENOUS | Status: DC

## 2020-03-07 SURGICAL SUPPLY — 31 items
BNDG COHESIVE 4X5 TAN STRL (GAUZE/BANDAGES/DRESSINGS) ×3 IMPLANT
BNDG ELASTIC 2X5.8 VLCR STR LF (GAUZE/BANDAGES/DRESSINGS) ×3 IMPLANT
BNDG ESMARK 4X12 TAN STRL LF (GAUZE/BANDAGES/DRESSINGS) ×3 IMPLANT
CANISTER SUCT 1200ML W/VALVE (MISCELLANEOUS) ×3 IMPLANT
CHLORAPREP W/TINT 26 (MISCELLANEOUS) ×3 IMPLANT
CORD BIP STRL DISP 12FT (MISCELLANEOUS) ×3 IMPLANT
COVER WAND RF STERILE (DRAPES) ×3 IMPLANT
CUFF TOURN SGL QUICK 18X4 (TOURNIQUET CUFF) ×3 IMPLANT
DRAPE SURG 17X11 SM STRL (DRAPES) ×3 IMPLANT
FORCEPS JEWEL BIP 4-3/4 STR (INSTRUMENTS) ×3 IMPLANT
GAUZE SPONGE 4X4 12PLY STRL (GAUZE/BANDAGES/DRESSINGS) ×3 IMPLANT
GAUZE XEROFORM 1X8 LF (GAUZE/BANDAGES/DRESSINGS) ×3 IMPLANT
GLOVE BIO SURGEON STRL SZ8 (GLOVE) ×3 IMPLANT
GLOVE INDICATOR 8.0 STRL GRN (GLOVE) ×3 IMPLANT
GOWN STRL REUS W/ TWL LRG LVL3 (GOWN DISPOSABLE) ×1 IMPLANT
GOWN STRL REUS W/ TWL XL LVL3 (GOWN DISPOSABLE) ×1 IMPLANT
GOWN STRL REUS W/TWL LRG LVL3 (GOWN DISPOSABLE) ×2
GOWN STRL REUS W/TWL XL LVL3 (GOWN DISPOSABLE) ×2
KIT CARPAL TUNNEL (MISCELLANEOUS) ×2
KIT ESCP INSRT D SLOT CANN KN (MISCELLANEOUS) ×1 IMPLANT
KIT TURNOVER KIT A (KITS) ×3 IMPLANT
NS IRRIG 500ML POUR BTL (IV SOLUTION) ×3 IMPLANT
PACK EXTREMITY (MISCELLANEOUS) ×3 IMPLANT
SPLINT WRIST LG LT TX990309 (SOFTGOODS) IMPLANT
SPLINT WRIST LG RT TX900304 (SOFTGOODS) IMPLANT
SPLINT WRIST M LT TX990308 (SOFTGOODS) ×3 IMPLANT
SPLINT WRIST M RT TX990303 (SOFTGOODS) IMPLANT
SPLINT WRIST XL LT TX990310 (SOFTGOODS) IMPLANT
SPLINT WRIST XL RT TX990305 (SOFTGOODS) IMPLANT
STOCKINETTE IMPERVIOUS 9X36 MD (GAUZE/BANDAGES/DRESSINGS) ×3 IMPLANT
SUT PROLENE 4 0 PS 2 18 (SUTURE) ×3 IMPLANT

## 2020-03-07 NOTE — Anesthesia Procedure Notes (Signed)
Procedure Name: LMA Insertion Date/Time: 03/07/2020 12:45 PM Performed by: Jaye Beagle, CRNA Pre-anesthesia Checklist: Patient identified, Emergency Drugs available, Suction available, Patient being monitored and Timeout performed Patient Re-evaluated:Patient Re-evaluated prior to induction Oxygen Delivery Method: Circle system utilized Preoxygenation: Pre-oxygenation with 100% oxygen Induction Type: IV induction Ventilation: Mask ventilation without difficulty LMA: LMA inserted LMA Size: 3.5 Number of attempts: 1

## 2020-03-07 NOTE — H&P (Signed)
History of Present Illness:  Kristin Stuart is a 69 y.o. female who presents for follow-up of her bilateral arm pain and paresthesias. The patient was last seen for the symptoms 3 months ago. Given her history of prior anterior cervical discectomy and fusions at C3-C5 and at C6-7, it was felt that her symptoms were coming from her neck, so she was referred to neurosurgery. She saw Dr. Myer Haff who sent her for EMGs of both upper extremities to look for evidence of cervical radiculitis. The patient finally underwent the EMG on 02/13/2020 and presents today for follow-up. She notes severe pain in both hands and wrists which she rates at 9/10. Her symptoms are aggravated at night, frequently awakening her from sleep despite wearing her wrist splints. They also bother her during the day as she has difficulty holding or grasping objects. She states that her fingertips feel like "sand". She denies any reinjury to her hands. 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  Current Outpatient Medications Ordered in Epic  Medication Sig Dispense Refill  . acetaminophen-codeine (TYLENOL #3) 300-30 mg tablet Take 1 tablet by mouth every 4 (four) hours as needed for Pain 90 tablet 5  . amLODIPine (NORVASC) 5 MG tablet Take 1 tablet (5 mg total) by mouth once daily 90 tablet 4  . aspirin 81 mg tablet Take 81 mg by mouth daily.  Marland Kitchen CARBAMIDE PEROXIDE (MURINE EAR WAX REMOVAL SYSTEM OTIC) Place in ear(s).  . carboxymethylcellulose (REFRESH LIQUIGEL) 1 % ophthalmic solution Apply 1 drop to eye nightly  . cyclobenzaprine (FLEXERIL) 10 MG tablet Take 1 tablet (10 mg total) by mouth 3 (three) times daily 90 tablet 0  . hydroCHLOROthiazide (HYDRODIURIL) 25 MG tablet Take 1 tablet (25 mg total) by mouth once daily 90 tablet 0  . levothyroxine (SYNTHROID) 112 MCG tablet Take 1 tablet (112 mcg total) by mouth once daily Take on an empty stomach with a glass of water at least 30-60 minutes before breakfast. 90 tablet 0  .  levothyroxine (SYNTHROID) 125 MCG tablet Take 1 tablet (125 mcg total) by mouth once daily Take on an empty stomach with a glass of water at least 30-60 minutes before breakfast. 90 tablet 4  . meloxicam (MOBIC) 15 MG tablet Take 1 tablet (15 mg total) by mouth once daily 90 tablet 1  . metoprolol succinate (TOPROL-XL) 50 MG XL tablet Take 1 tablet (50 mg total) by mouth once daily 90 tablet 0  . simvastatin (ZOCOR) 20 MG tablet Take 1 tablet (20 mg total) by mouth nightly 90 tablet 4  . TRILEPTAL 300 mg tablet Take 1 tablet (300 mg total) by mouth 2 (two) times daily. 180 tablet 4   No current Epic-ordered facility-administered medications on file.   Allergies  Allergen Reactions  . Penicillins Other (See Comments)  Vaginal infection  . Amlodipine Swelling and Other (See Comments)  Lower extremity swelling Ankle swelling   Past Medical History:  Diagnosis Date  . Abnormal mammogram  benign  . Chest pain, atypical  normal stress test 2007, cardiac CT angiogram at [facility] Oct 2008 showing very mild coronary disease  . Chronic back pain  and neck  . Chronic pain  . Edema of both legs  . Fibromyalgia  . GERD (gastroesophageal reflux disease) 08/16/2007--EGD  hiatal hernia; repeat EGD 1/08 was normal  . H/O reactive hypoglycemia  . Hiatal hernia  . Hypercholesterolemia  . Hypercholesterolemia 04/25/2015  . Hypertension  . Low back pain  . Neck pain  .  Pill dysphagia, unspecified  normal barium swallow 2010.  Marland Kitchen Plantar fasciitis  . Plantar fasciitis  . Prolapse of female bladder, acquired  s/p repair  . Reactive hypoglycemia  . Stasis dermatitis of both legs 10/17/2014  . TIA (transient ischemic attack)  . Unspecified hypothyroidism   Past Surgical History:  Procedure Laterality Date  . acdf  c3-c7, Dr. Acie Fredrickson 2008  . ACDF 1993  . BACK SURGERY  . BREAST EXCISIONAL BIOPSY  benign  . CYSTOPLASTY  . HEMORRHOIDECTOMY EXTERNAL  . HYSTERECTOMY  . lumbar  discectomy surgery 2000  . POSTERIOR FUSION LUMBAR SPINE 2000  . Reverse Reverse TSA Right 12/20/2018  Shreena Baines  . SPINE SURGERY   Family History  Problem Relation Age of Onset  . Lung cancer Father  . Diabetes type II Sister  . Coronary Artery Disease (Blocked arteries around heart) Maternal Grandmother  . Hepatitis C Brother  . Brain cancer Daughter  . Alcohol abuse Maternal Uncle  . Cirrhosis Maternal Uncle  . Other Paternal Aunt  ddd  . Diabetes type II Paternal Grandmother   Social History   Socioeconomic History  . Marital status: Single  Spouse name: Not on file  . Number of children: Not on file  . Years of education: 75  . Highest education level: Not on file  Occupational History  . Not on file  Tobacco Use  . Smoking status: Former Smoker  Quit date: 10/12/1998  Years since quitting: 21.3  . Smokeless tobacco: Never Used  Substance and Sexual Activity  . Alcohol use: Yes  Alcohol/week: 1.0 standard drinks  Types: 1 Glasses of wine per week  . Drug use: No  . Sexual activity: Never  Other Topics Concern  . Not on file  Social History Narrative  Divorced, lives alone. On disability d/t chronic pain. Exercise: working on. Wears seatbelt, will work on sunscreen. Dentist: will work on. Calcium in diet: yes. Religious beliefs affecting health care: none.   Advanced Directives: wants friend Harrell Lark [phone] to make her decision   Social Determinants of Health   Financial Resource Strain:  . Difficulty of Paying Living Expenses:  Food Insecurity:  . Worried About Programme researcher, broadcasting/film/video in the Last Year:  . Barista in the Last Year:  Transportation Needs:  . Freight forwarder (Medical):  Marland Kitchen Lack of Transportation (Non-Medical):   Review of Systems:  A comprehensive 14 point ROS was performed, reviewed, and the pertinent orthopaedic findings are documented in the HPI.  Physical Exam: Vitals:  02/26/20 1146  Weight: 70.3 kg (155 lb)   Height: 161.3 cm (5' 3.5")  PainSc: 9  PainLoc: Wrist   General/Constitutional: The patient appears to be well-nourished, well-developed, and in no acute distress. Neuro/Psych: Normal mood and affect, oriented to person, place and time. Eyes: Non-icteric. Pupils are equal, round, and reactive to light, and exhibit synchronous movement. ENT: Unremarkable. Lymphatic: No palpable adenopathy. Respiratory: Lungs clear to auscultation, Normal chest excursion, No wheezes and Non-labored breathing Cardiovascular: Regular rate and rhythm. No murmurs. and No edema, swelling or tenderness, except as noted in detailed exam. Integumentary: No impressive skin lesions present, except as noted in detailed exam. Musculoskeletal: Unremarkable, except as noted in detailed exam.  Left wrist exam: Skin inspection of the left hand and wrist is notable for a very small mass on the volar aspect of her wrist measuring approximately 0.8 cm (consistent with a volar carpal ganglion), but otherwise is unremarkable. No swelling, erythema, ecchymosis, abrasions,  or other skin abnormalities are identified. She has no tenderness to palpation around the wrist or hand region. She exhibits full range of motion of her wrist both actively and passively, and is able to actively flex and extend all digits fully without any pain or triggering. She is neurovascularly intact to all digits, other than subjectively decreased sensation of light touch to the thumb, index, long, and ring fingers. She has a positive Phalen's test as well as a positive Tinel's test.  EMG results:  An EMG of both upper extremities was performed recently and the results are available for review. By report, the EMG demonstrates evidence of moderate carpal tunnel syndrome on the right, and severe carpal tunnel syndrome on the left. There is no evidence for cervical radiculitis. This report was reviewed by myself and discussed with the patient.  Assessment: .  Carpal tunnel syndrome, left   Plan: The treatment options were discussed with the patient. In addition, patient educational materials were provided regarding the diagnosis and treatment options. The patient is quite frustrated by her symptoms and functional limitations, and is ready to consider more aggressive treatment options. Therefore, I have recommended a surgical procedure, specifically an endoscopic left carpal tunnel release, as the EMG suggest that her symptoms are more severe on the left side. The procedure was discussed with the patient, as were the potential risks (including bleeding, infection, nerve and/or blood vessel injury, persistent or recurrent pain/paresthesias, weakness of grip, need for further surgery, blood clots, strokes, heart attacks and/or arhythmias, pneumonia, etc.) and benefits. The patient states his/her understanding and wishes to proceed. All of the patient's questions and concerns were answered. She can call any time with further concerns. She will follow up post-surgery, routine.    H&P reviewed and patient re-examined. No changes.

## 2020-03-07 NOTE — Op Note (Signed)
03/07/2020  1:34 PM  Patient:   Kristin Stuart  Pre-Op Diagnosis:   Left carpal tunnel syndrome.  Post-Op Diagnosis:   Same.  Procedure:   Endoscopic left carpal tunnel release.  Surgeon:   Maryagnes Amos, MD  Anesthesia:   General LMA  Assistant:   Gwenlyn Fudge, RNFA  Findings:   As above.  Complications:   None  EBL:   0 cc  Fluids:   900 cc crystalloid  TT:   21 minutes at 250 mmHg  Drains:   None  Closure:   4-0 Prolene interrupted sutures  Brief Clinical Note:   The patient is a 69 year old female with a history of progressively worsening pain and paresthesias to her left hand. Her symptoms have progressed despite medications, activity modification, etc. Her history and examination are consistent with carpal tunnel syndrome, confirmed by EMG. The patient presents at this time for an endoscopic left carpal tunnel release.   Procedure:   The patient was brought into the operating room and lain in the supine position. After adequate general laryngeal mask anesthesia was obtained, the left hand and upper extremity were prepped with ChloraPrep solution before being draped sterilely. Preoperative antibiotics were administered. A timeout was performed to verify the appropriate surgical site before the limb was exsanguinated with an Esmarch and the tourniquet inflated to 250 mmHg. An approximately 1.5-2 cm incision was made over the volar wrist flexion crease, centered over the palmaris longus tendon. The incision was carried down through the subcutaneous tissues with care taken to identify and protect any neurovascular structures. The distal forearm fascia was penetrated just proximal to the transverse carpal ligament. The soft tissues were released off the superficial and deep surfaces of the distal forearm fascia and this was released proximally for 3-4 cm under direct visualization.  Attention was directed distally. The Therapist, nutritional was passed beneath the transverse carpal  ligament along the ulnar aspect of the carpal tunnel and used to release any adhesions as well as to remove any adherent synovial tissue before first the smaller then the larger of the two dilators were passed beneath the transverse carpal ligament along the ulnar margin of the carpal tunnel. The slotted cannula was introduced and the endoscope was placed into the slotted cannula and the undersurface of the transverse carpal ligament visualized. The distal margin of the transverse carpal ligament was marked by placing a 25-gauge needle percutaneously at Kaplan's cardinal point so that it entered the distal portion of the slotted cannula. Under endoscopic visualization, the transverse carpal ligament was released from proximal to distal using the end-cutting blade. A second pass was performed to ensure complete release of the ligament. The adequacy of release was verified both endoscopically and by palpation using the freer elevator.  The wound was irrigated thoroughly with sterile saline solution before being closed using 4-0 Prolene interrupted sutures. A total of 10 cc of 0.5% plain Sensorcaine was injected in and around the incision before a sterile bulky dressing was applied to the wound. The patient was placed into a volar wrist splint before being awakened and returned to the recovery room in satisfactory condition after tolerating the procedure well.

## 2020-03-07 NOTE — Discharge Instructions (Addendum)
Orthopedic discharge instructions: Keep dressing dry and intact. Keep hand elevated above heart level. May shower after dressing removed on postop day 4 (Monday). Cover sutures with Band-Aids after drying off. Apply ice to affected area frequently. Take ibuprofen 600 mg TID with meals for 5-7 days, then as necessary. Take ES Tylenol or pain medication as prescribed when needed.  Return for follow-up in 10-14 days or as scheduled.   AMBULATORY SURGERY  DISCHARGE INSTRUCTIONS   1) The drugs that you were given will stay in your system until tomorrow so for the next 24 hours you should not:  A) Drive an automobile B) Make any legal decisions C) Drink any alcoholic beverage   2) You may resume regular meals tomorrow.  Today it is better to start with liquids and gradually work up to solid foods.  You may eat anything you prefer, but it is better to start with liquids, then soup and crackers, and gradually work up to solid foods.   3) Please notify your doctor immediately if you have any unusual bleeding, trouble breathing, redness and pain at the surgery site, drainage, fever, or pain not relieved by medication.    4) Additional Instructions:        Please contact your physician with any problems or Same Day Surgery at [phone], Monday through Friday 6 am to 4 pm, or [place] at [facility] number at [phone].

## 2020-03-07 NOTE — Transfer of Care (Signed)
Immediate Anesthesia Transfer of Care Note  Patient: Kristin Stuart  Procedure(s) Performed: CARPAL TUNNEL RELEASE ENDOSCOPIC (Left Wrist)  Patient Location: PACU  Anesthesia Type:General  Level of Consciousness: awake, alert  and drowsy  Airway & Oxygen Therapy: Patient Spontanous Breathing and Patient connected to nasal cannula oxygen  Post-op Assessment: Report given to RN and Post -op Vital signs reviewed and stable  Post vital signs: Reviewed and stable  Last Vitals:  Vitals Value Taken Time  BP 129/64 03/07/20 1339  Temp    Pulse 82 03/07/20 1339  Resp 13 03/07/20 1339  SpO2 100 % 03/07/20 1339    Last Pain:  Vitals:   03/07/20 1120  TempSrc: Temporal  PainSc: 8          Complications: No apparent anesthesia complications

## 2020-03-07 NOTE — Anesthesia Preprocedure Evaluation (Addendum)
Anesthesia Evaluation  Patient identified by MRN, date of birth, ID band Patient awake    Reviewed: Allergy & Precautions, H&P , NPO status , Patient's Chart, lab work & pertinent test results  History of Anesthesia Complications (+) PONV and history of anesthetic complications  Airway Mallampati: II  TM Distance: <3 FB Neck ROM: full    Dental  (+) Teeth Intact   Pulmonary neg pulmonary ROS, neg COPD, former smoker,    Pulmonary exam normal        Cardiovascular hypertension, (-) Past MI Normal cardiovascular exam(-) dysrhythmias      Neuro/Psych  Headaches, Takes Tylenol 3 for chronic back pain TIAnegative psych ROS   GI/Hepatic Neg liver ROS, hiatal hernia, GERD  ,  Endo/Other  Hypothyroidism   Renal/GU      Musculoskeletal  (+) Arthritis , Fibromyalgia -  Abdominal   Peds  Hematology negative hematology ROS (+)   Anesthesia Other Findings Past Medical History: No date: Anemia No date: Arthritis No date: Edema     Comment:  LEGS/FEET No date: Edema     Comment:  legs/feet No date: Fibromyalgia No date: GERD (gastroesophageal reflux disease) No date: History of hiatal hernia No date: Hypercholesteremia No date: Hypertension No date: Hypothyroid No date: Plantar fasciitis No date: PONV (postoperative nausea and vomiting) 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 No date: Raynaud's syndrome No date: Stroke [facility])     Comment:  tia 2012: TIA (transient ischemic attack)  Past Surgical History: 1975: ABDOMINAL HYSTERECTOMY 1993, 2011: ANTERIOR CERVICAL DECOMP/DISCECTOMY FUSION     Comment:  C5-C6, C3-C4 No date: BACK SURGERY     Comment:  x4 2010: BLADDER SUSPENSION 1981: BREAST CYST EXCISION; Right     Comment:  neg 1980: BREAST EXCISIONAL BIOPSY; Left     Comment:  NEG 1980: BREAST LUMPECTOMY; Left     Comment:  benign 06/25/2015: CATARACT EXTRACTION W/PHACO; Right     Comment:  Procedure: CATARACT  EXTRACTION PHACO AND INTRAOCULAR               LENS PLACEMENT (IOC);  Surgeon: Galen Manila, MD;                Location: [facility] ORS;  Service: Ophthalmology;  Laterality:              Right;  US:00:54.9 AP:25.9 CDE:14.22 LOT PAK #[id number] H 07/16/2015: CATARACT EXTRACTION W/PHACO; Left     Comment:  Procedure: CATARACT EXTRACTION PHACO AND INTRAOCULAR               LENS PLACEMENT (IOC);  Surgeon: Galen Manila, MD;                Location: [facility] ORS;  Service: Ophthalmology;  Laterality:              Left;  [place]: 00:21.5 AP%: 20.1 CDE: 4.31 Lot # [id number] 2014: EXCISIONAL HEMORRHOIDECTOMY 12/20/2018: JOINT REPLACEMENT     Comment:  right shoulder 1999: LUMBAR DISC SURGERY     Comment:  L4-L5 20014: POLYPECTOMY     Comment:  3 polyps removed from large intestines 12/20/2018: REVERSE SHOULDER ARTHROPLASTY; Right     Comment:  Procedure: REVERSE SHOULDER ARTHROPLASTY;  Surgeon:               Christena Flake, MD;  Location: [facility] ORS;  Service:               Orthopedics;  Laterality: Right; 1974: TUBAL LIGATION     Reproductive/Obstetrics negative OB ROS  Anesthesia Physical Anesthesia Plan  ASA: II  Anesthesia Plan: General LMA   Post-op Pain Management:    Induction:   PONV Risk Score and Plan: Dexamethasone, Ondansetron, Treatment may vary due to age or medical condition and Scopolamine patch - Pre-op  Airway Management Planned:   Additional Equipment:   Intra-op Plan:   Post-operative Plan:   Informed Consent: I have reviewed the patients History and Physical, chart, labs and discussed the procedure including the risks, benefits and alternatives for the proposed anesthesia with the patient or authorized representative who has indicated his/her understanding and acceptance.     Dental Advisory Given  Plan Discussed with: Anesthesiologist  Anesthesia Plan Comments:         Anesthesia Quick Evaluation

## 2020-03-08 NOTE — Anesthesia Postprocedure Evaluation (Signed)
Anesthesia Post Note  Patient: Kristin Stuart  Procedure(s) Performed: CARPAL TUNNEL RELEASE ENDOSCOPIC (Left Wrist)  Patient location during evaluation: PACU Anesthesia Type: General Level of consciousness: awake and alert Pain management: pain level controlled Vital Signs Assessment: post-procedure vital signs reviewed and stable Respiratory status: spontaneous breathing, nonlabored ventilation and respiratory function stable Cardiovascular status: blood pressure returned to baseline and stable Postop Assessment: no apparent nausea or vomiting Anesthetic complications: no     Last Vitals:  Vitals:   03/07/20 1423 03/07/20 1452  BP: 125/72 (!) 146/68  Pulse: 80 73  Resp: (!) 9 16  Temp:  36.6 C  SpO2: 93% 100%    Last Pain:  Vitals:   03/08/20 0853  TempSrc:   PainSc: 5                  Karleen Hampshire

## 2020-04-04 ENCOUNTER — Other Ambulatory Visit: Payer: Self-pay | Admitting: Surgery

## 2020-04-08 ENCOUNTER — Other Ambulatory Visit: Payer: Self-pay

## 2020-04-08 ENCOUNTER — Encounter
Admission: RE | Admit: 2020-04-08 | Discharge: 2020-04-08 | Disposition: A | Discharge status: Home / Self Care | Payer: Medicare HMO | Source: Ambulatory Visit | Attending: Surgery | Admitting: Surgery

## 2020-04-08 HISTORY — DX: Carpal tunnel syndrome, bilateral upper limbs: G56.03

## 2020-04-08 NOTE — Patient Instructions (Addendum)
Your procedure is scheduled on: Wednesday, June 9 Report to Day Surgery on the 2nd floor of the [facility]. To find out your arrival time, please call [phone] between 1PM - 3PM on: Tuesday, June 8  REMEMBER: Instructions that are not followed completely may result in serious medical risk, up to and including death; or upon the discretion of your surgeon and anesthesiologist your surgery may need to be rescheduled.  Do not eat food after midnight the night before surgery.  No gum chewing, lozengers or hard candies.  You may however, drink CLEAR liquids up to 2 hours before you are scheduled to arrive for your surgery. Do not drink anything within 2 hours of your scheduled arrival time.  Clear liquids include: - water  - apple juice without pulp - gatorade (not RED) - black coffee or tea (Do NOT add milk or creamers to the coffee or tea) Do NOT drink anything that is not on this list.  ENSURE PRE-SURGERY CARBOHYDRATE DRINK:  Complete drinking 2 hours prior to scheduled arrival time.  TAKE THESE MEDICATIONS THE MORNING OF SURGERY WITH A SIP OF WATER:  1.  Amlodipine 2.  Levothyroxine 3.  Metoprolol 4.  Oxcarbazepine/Trileptal 5.  Tylenol if needed for pain  Stop MELOXICAM and Anti-inflammatories (NSAIDS) such as Advil, Aleve, Ibuprofen, Motrin, Naproxen, Naprosyn and Aspirin based products such as Excedrin, Goodys Powder, BC Powder. (May take Tylenol or Acetaminophen if needed.)  Stop ANY OVER THE COUNTER supplements until after surgery.  No Alcohol for 24 hours before or after surgery.  No Smoking including e-cigarettes for 24 hours prior to surgery.  No chewable tobacco products for at least 6 hours prior to surgery.  No nicotine patches on the day of surgery.  Do not use any "recreational" drugs for at least a week prior to your surgery.  Please be advised that the combination of cocaine and anesthesia may have negative outcomes, up to and including death. If you  test positive for cocaine, your surgery will be cancelled.  On the morning of surgery brush your teeth with toothpaste and water, you may rinse your mouth with mouthwash if you wish. Do not swallow any toothpaste or mouthwash.  Do not wear jewelry, make-up, hairpins, clips or nail polish.  Do not wear lotions, powders, or perfumes.   Do not shave 48 hours prior to surgery.   Contact lenses, hearing aids and dentures may not be worn into surgery.  Do not bring valuables to the hospital. [facility] is not responsible for any missing/lost belongings or valuables.   Use CHG Soap as directed on instruction sheet.  Notify your doctor if there is any change in your medical condition (cold, fever, infection).  Wear comfortable clothing (specific to your surgery type) to the hospital.  Plan for stool softeners for home use; pain medications have a tendency to cause constipation. You can also help prevent constipation by eating foods high in fiber such as fruits and vegetables and drinking plenty of fluids as your diet allows.  After surgery, you can help prevent lung complications by doing breathing exercises.  Take deep breaths and cough every 1-2 hours. Your doctor may order a device called an Incentive Spirometer to help you take deep breaths.  If you are being discharged the day of surgery, you will not be allowed to drive home. You will need a responsible adult (18 years or older) to drive you home and stay with you that night.   If you are  taking public transportation, you will need to have a responsible adult (18 years or older) with you. Please confirm with your physician that it is acceptable to use public transportation.   Please call the Pre-admissions Testing Dept. at [phone] if you have any questions about these instructions.  Visitation Policy:  Patients undergoing a surgery or procedure may have one family member or support person with them as long as that person is  not COVID-19 positive or experiencing its symptoms.  That person may remain in the waiting area during the procedure.  As a reminder, masks are still required for all [place] team members, patients and visitors in all [facility] facilities.   Systemwide, no visitors 17 or younger.

## 2020-04-09 ENCOUNTER — Other Ambulatory Visit
Admission: RE | Admit: 2020-04-09 | Discharge: 2020-04-09 | Disposition: A | Discharge status: Home / Self Care | Payer: Medicare HMO | Source: Ambulatory Visit | Attending: Surgery | Admitting: Surgery

## 2020-04-09 MED ORDER — CLINDAMYCIN PHOSPHATE 900 MG/50ML IV SOLN
900.0000 mg | INTRAVENOUS | Status: AC
  Administered 2020-04-10: 900 mg via INTRAVENOUS

## 2020-04-10 ENCOUNTER — Ambulatory Visit
Admission: RE | Admit: 2020-04-10 | Discharge: 2020-04-10 | Disposition: A | Discharge status: Home / Self Care | Payer: Medicare HMO | Source: Ambulatory Visit | Attending: Surgery | Admitting: Surgery

## 2020-04-10 ENCOUNTER — Ambulatory Visit: Payer: Medicare HMO | Admitting: Certified Registered"

## 2020-04-10 ENCOUNTER — Encounter
Admission: RE | Disposition: A | Discharge status: Home / Self Care | Payer: Self-pay | Source: Ambulatory Visit | Attending: Surgery

## 2020-04-10 ENCOUNTER — Other Ambulatory Visit: Payer: Self-pay

## 2020-04-10 ENCOUNTER — Encounter: Payer: Self-pay | Admitting: Surgery

## 2020-04-10 DIAGNOSIS — Z79890 Hormone replacement therapy (postmenopausal): Secondary | ICD-10-CM | POA: Insufficient documentation

## 2020-04-10 DIAGNOSIS — Z7982 Long term (current) use of aspirin: Secondary | ICD-10-CM | POA: Insufficient documentation

## 2020-04-10 DIAGNOSIS — Z791 Long term (current) use of non-steroidal anti-inflammatories (NSAID): Secondary | ICD-10-CM | POA: Insufficient documentation

## 2020-04-10 DIAGNOSIS — I10 Essential (primary) hypertension: Secondary | ICD-10-CM | POA: Insufficient documentation

## 2020-04-10 DIAGNOSIS — Z888 Allergy status to other drugs, medicaments and biological substances status: Secondary | ICD-10-CM | POA: Insufficient documentation

## 2020-04-10 DIAGNOSIS — E7800 Pure hypercholesterolemia, unspecified: Secondary | ICD-10-CM | POA: Diagnosis not present

## 2020-04-10 DIAGNOSIS — M797 Fibromyalgia: Secondary | ICD-10-CM | POA: Insufficient documentation

## 2020-04-10 DIAGNOSIS — G5601 Carpal tunnel syndrome, right upper limb: Secondary | ICD-10-CM | POA: Insufficient documentation

## 2020-04-10 DIAGNOSIS — K219 Gastro-esophageal reflux disease without esophagitis: Secondary | ICD-10-CM | POA: Insufficient documentation

## 2020-04-10 DIAGNOSIS — E039 Hypothyroidism, unspecified: Secondary | ICD-10-CM | POA: Insufficient documentation

## 2020-04-10 DIAGNOSIS — Z8673 Personal history of transient ischemic attack (TIA), and cerebral infarction without residual deficits: Secondary | ICD-10-CM | POA: Diagnosis not present

## 2020-04-10 DIAGNOSIS — Z880 Allergy status to penicillin: Secondary | ICD-10-CM | POA: Diagnosis not present

## 2020-04-10 DIAGNOSIS — Z79899 Other long term (current) drug therapy: Secondary | ICD-10-CM | POA: Diagnosis not present

## 2020-04-10 HISTORY — PX: CARPAL TUNNEL RELEASE: SHX101

## 2020-04-10 LAB — URINE DRUG SCREEN, QUALITATIVE (ARMC ONLY)
Amphetamines, Ur Screen: NOT DETECTED
Barbiturates, Ur Screen: NOT DETECTED
Benzodiazepine, Ur Scrn: NOT DETECTED
Cannabinoid 50 Ng, Ur ~~LOC~~: NOT DETECTED
Cocaine Metabolite,Ur ~~LOC~~: NOT DETECTED
MDMA (Ecstasy)Ur Screen: NOT DETECTED
Methadone Scn, Ur: NOT DETECTED
Opiate, Ur Screen: POSITIVE — AB
Phencyclidine (PCP) Ur S: NOT DETECTED
Tricyclic, Ur Screen: NOT DETECTED

## 2020-04-10 SURGERY — RELEASE, CARPAL TUNNEL, ENDOSCOPIC
Anesthesia: General | Site: Wrist | Laterality: Right

## 2020-04-10 MED ORDER — FENTANYL CITRATE (PF) 100 MCG/2ML IJ SOLN
INTRAMUSCULAR | Status: AC
  Filled 2020-04-10: qty 2

## 2020-04-10 MED ORDER — PROPOFOL 10 MG/ML IV BOLUS
INTRAVENOUS | Status: DC | PRN
  Administered 2020-04-10: 120 mg via INTRAVENOUS

## 2020-04-10 MED ORDER — POTASSIUM CHLORIDE IN NACL 20-0.9 MEQ/L-% IV SOLN: INTRAVENOUS | Status: DC

## 2020-04-10 MED ORDER — PROMETHAZINE HCL 25 MG/ML IJ SOLN
INTRAMUSCULAR | Status: AC
  Administered 2020-04-10: 6.25 mg via INTRAVENOUS
  Filled 2020-04-10: qty 1

## 2020-04-10 MED ORDER — BUPIVACAINE HCL (PF) 0.5 % IJ SOLN
INTRAMUSCULAR | Status: AC
  Filled 2020-04-10: qty 30

## 2020-04-10 MED ORDER — EPHEDRINE SULFATE 50 MG/ML IJ SOLN
INTRAMUSCULAR | Status: DC | PRN
  Administered 2020-04-10 (×2): 5 mg via INTRAVENOUS
  Administered 2020-04-10: 10 mg via INTRAVENOUS

## 2020-04-10 MED ORDER — FAMOTIDINE 20 MG PO TABS
ORAL_TABLET | ORAL | Status: AC
  Administered 2020-04-10: 20 mg via ORAL
  Filled 2020-04-10: qty 1

## 2020-04-10 MED ORDER — FENTANYL CITRATE (PF) 100 MCG/2ML IJ SOLN
INTRAMUSCULAR | Status: DC | PRN
  Administered 2020-04-10 (×2): 25 ug via INTRAVENOUS
  Administered 2020-04-10: 50 ug via INTRAVENOUS

## 2020-04-10 MED ORDER — PROMETHAZINE HCL 25 MG/ML IJ SOLN: 6.2500 mg | Freq: Once | INTRAMUSCULAR | Status: AC

## 2020-04-10 MED ORDER — ONDANSETRON HCL 4 MG/2ML IJ SOLN
4.0000 mg | Freq: Once | INTRAMUSCULAR | Status: AC | PRN
  Administered 2020-04-10: 4 mg via INTRAVENOUS

## 2020-04-10 MED ORDER — KETOROLAC TROMETHAMINE 30 MG/ML IJ SOLN
INTRAMUSCULAR | Status: DC | PRN
  Administered 2020-04-10: 15 mg via INTRAVENOUS

## 2020-04-10 MED ORDER — FENTANYL CITRATE (PF) 100 MCG/2ML IJ SOLN
25.0000 ug | INTRAMUSCULAR | Status: DC | PRN
  Administered 2020-04-10: 25 ug via INTRAVENOUS
  Administered 2020-04-10: 50 ug via INTRAVENOUS

## 2020-04-10 MED ORDER — ACETAMINOPHEN 10 MG/ML IV SOLN
INTRAVENOUS | Status: AC
  Filled 2020-04-10: qty 100

## 2020-04-10 MED ORDER — METOCLOPRAMIDE HCL 10 MG PO TABS: 5.0000 mg | ORAL_TABLET | Freq: Three times a day (TID) | ORAL | Status: DC | PRN

## 2020-04-10 MED ORDER — CHLORHEXIDINE GLUCONATE 0.12 % MT SOLN
OROMUCOSAL | Status: AC
  Administered 2020-04-10: 15 mL via OROMUCOSAL
  Filled 2020-04-10: qty 15

## 2020-04-10 MED ORDER — HYDROCODONE-ACETAMINOPHEN 5-325 MG PO TABS: 1.0000 | ORAL_TABLET | Freq: Four times a day (QID) | ORAL | 0 refills | Status: DC | PRN

## 2020-04-10 MED ORDER — PHENYLEPHRINE HCL (PRESSORS) 10 MG/ML IV SOLN
INTRAVENOUS | Status: DC | PRN
  Administered 2020-04-10: 100 ug via INTRAVENOUS

## 2020-04-10 MED ORDER — BUPIVACAINE HCL (PF) 0.5 % IJ SOLN
INTRAMUSCULAR | Status: DC | PRN
  Administered 2020-04-10: 10 mL

## 2020-04-10 MED ORDER — FENTANYL CITRATE (PF) 100 MCG/2ML IJ SOLN
INTRAMUSCULAR | Status: AC
  Administered 2020-04-10: 25 ug via INTRAVENOUS
  Filled 2020-04-10: qty 2

## 2020-04-10 MED ORDER — GLYCOPYRROLATE 0.2 MG/ML IJ SOLN
INTRAMUSCULAR | Status: DC | PRN
  Administered 2020-04-10: .2 mg via INTRAVENOUS

## 2020-04-10 MED ORDER — HYDROCODONE-ACETAMINOPHEN 5-325 MG PO TABS
ORAL_TABLET | ORAL | Status: AC
  Administered 2020-04-10: 2 via ORAL
  Filled 2020-04-10: qty 2

## 2020-04-10 MED ORDER — CLINDAMYCIN PHOSPHATE 900 MG/50ML IV SOLN
INTRAVENOUS | Status: AC
  Filled 2020-04-10: qty 50

## 2020-04-10 MED ORDER — FAMOTIDINE 20 MG PO TABS: 20.0000 mg | ORAL_TABLET | Freq: Once | ORAL | Status: AC

## 2020-04-10 MED ORDER — CHLORHEXIDINE GLUCONATE 0.12 % MT SOLN: 15.0000 mL | Freq: Once | OROMUCOSAL | Status: AC

## 2020-04-10 MED ORDER — SODIUM CHLORIDE FLUSH 0.9 % IV SOLN
INTRAVENOUS | Status: AC
  Filled 2020-04-10: qty 10

## 2020-04-10 MED ORDER — DEXAMETHASONE SODIUM PHOSPHATE 10 MG/ML IJ SOLN
INTRAMUSCULAR | Status: DC | PRN
  Administered 2020-04-10: 10 mg via INTRAVENOUS

## 2020-04-10 MED ORDER — METOCLOPRAMIDE HCL 5 MG/ML IJ SOLN: 5.0000 mg | Freq: Three times a day (TID) | INTRAMUSCULAR | Status: DC | PRN

## 2020-04-10 MED ORDER — ONDANSETRON HCL 4 MG/2ML IJ SOLN: 4.0000 mg | Freq: Four times a day (QID) | INTRAMUSCULAR | Status: DC | PRN

## 2020-04-10 MED ORDER — ONDANSETRON HCL 4 MG/2ML IJ SOLN
INTRAMUSCULAR | Status: DC | PRN
  Administered 2020-04-10: 4 mg via INTRAVENOUS

## 2020-04-10 MED ORDER — ACETAMINOPHEN 10 MG/ML IV SOLN
INTRAVENOUS | Status: DC | PRN
  Administered 2020-04-10: 1000 mg via INTRAVENOUS

## 2020-04-10 MED ORDER — ONDANSETRON HCL 4 MG/2ML IJ SOLN
INTRAMUSCULAR | Status: AC
  Filled 2020-04-10: qty 2

## 2020-04-10 MED ORDER — ONDANSETRON HCL 4 MG PO TABS: 4.0000 mg | ORAL_TABLET | Freq: Four times a day (QID) | ORAL | Status: DC | PRN

## 2020-04-10 MED ORDER — LACTATED RINGERS IV SOLN: INTRAVENOUS | Status: DC

## 2020-04-10 MED ORDER — HYDROCODONE-ACETAMINOPHEN 5-325 MG PO TABS: 1.0000 | ORAL_TABLET | ORAL | Status: DC | PRN

## 2020-04-10 MED ORDER — ORAL CARE MOUTH RINSE: 15.0000 mL | Freq: Once | OROMUCOSAL | Status: AC

## 2020-04-10 SURGICAL SUPPLY — 31 items
BNDG COHESIVE 4X5 TAN STRL (GAUZE/BANDAGES/DRESSINGS) ×3 IMPLANT
BNDG ELASTIC 2X5.8 VLCR STR LF (GAUZE/BANDAGES/DRESSINGS) ×3 IMPLANT
BNDG ESMARK 4X12 TAN STRL LF (GAUZE/BANDAGES/DRESSINGS) ×3 IMPLANT
CANISTER SUCT 1200ML W/VALVE (MISCELLANEOUS) ×3 IMPLANT
CHLORAPREP W/TINT 26 (MISCELLANEOUS) ×3 IMPLANT
CORD BIP STRL DISP 12FT (MISCELLANEOUS) ×3 IMPLANT
COVER WAND RF STERILE (DRAPES) ×3 IMPLANT
CUFF TOURN SGL QUICK 18X4 (TOURNIQUET CUFF) ×3 IMPLANT
DRAPE SURG 17X11 SM STRL (DRAPES) ×3 IMPLANT
FORCEPS JEWEL BIP 4-3/4 STR (INSTRUMENTS) ×3 IMPLANT
GAUZE SPONGE 4X4 12PLY STRL (GAUZE/BANDAGES/DRESSINGS) ×3 IMPLANT
GAUZE XEROFORM 1X8 LF (GAUZE/BANDAGES/DRESSINGS) ×3 IMPLANT
GLOVE BIO SURGEON STRL SZ8 (GLOVE) ×3 IMPLANT
GLOVE INDICATOR 8.0 STRL GRN (GLOVE) ×3 IMPLANT
GOWN STRL REUS W/ TWL LRG LVL3 (GOWN DISPOSABLE) ×1 IMPLANT
GOWN STRL REUS W/ TWL XL LVL3 (GOWN DISPOSABLE) ×1 IMPLANT
GOWN STRL REUS W/TWL LRG LVL3 (GOWN DISPOSABLE) ×2
GOWN STRL REUS W/TWL XL LVL3 (GOWN DISPOSABLE) ×2
KIT CARPAL TUNNEL (MISCELLANEOUS) ×2
KIT ESCP INSRT D SLOT CANN KN (MISCELLANEOUS) ×1 IMPLANT
KIT TURNOVER KIT A (KITS) ×3 IMPLANT
NS IRRIG 500ML POUR BTL (IV SOLUTION) ×3 IMPLANT
PACK EXTREMITY (MISCELLANEOUS) ×3 IMPLANT
SPLINT WRIST LG LT TX990309 (SOFTGOODS) IMPLANT
SPLINT WRIST LG RT TX900304 (SOFTGOODS) ×3 IMPLANT
SPLINT WRIST M LT TX990308 (SOFTGOODS) IMPLANT
SPLINT WRIST M RT TX990303 (SOFTGOODS) IMPLANT
SPLINT WRIST XL LT TX990310 (SOFTGOODS) IMPLANT
SPLINT WRIST XL RT TX990305 (SOFTGOODS) IMPLANT
STOCKINETTE IMPERVIOUS 9X36 MD (GAUZE/BANDAGES/DRESSINGS) ×3 IMPLANT
SUT PROLENE 4 0 PS 2 18 (SUTURE) ×3 IMPLANT

## 2020-04-10 NOTE — Anesthesia Preprocedure Evaluation (Signed)
Anesthesia Evaluation  Patient identified by MRN, date of birth, ID band Patient awake    Reviewed: Allergy & Precautions, H&P , NPO status , Patient's Chart, lab work & pertinent test results  History of Anesthesia Complications (+) PONV and history of anesthetic complications  Airway Mallampati: II  TM Distance: <3 FB Neck ROM: full    Dental  (+) Teeth Intact   Pulmonary neg pulmonary ROS, neg shortness of breath, neg COPD, neg recent URI, former smoker,    Pulmonary exam normal        Cardiovascular hypertension, (-) angina(-) Past MI Normal cardiovascular exam(-) dysrhythmias      Neuro/Psych  Headaches, Takes Tylenol 3 for chronic back pain TIAnegative psych ROS   GI/Hepatic Neg liver ROS, hiatal hernia, GERD  ,  Endo/Other  Hypothyroidism   Renal/GU      Musculoskeletal  (+) Arthritis , Fibromyalgia -  Abdominal   Peds  Hematology negative hematology ROS (+)   Anesthesia Other Findings Past Medical History: No date: Anemia No date: Arthritis No date: Edema     Comment:  LEGS/FEET No date: Edema     Comment:  legs/feet No date: Fibromyalgia No date: GERD (gastroesophageal reflux disease) No date: History of hiatal hernia No date: Hypercholesteremia No date: Hypertension No date: Hypothyroid No date: Plantar fasciitis No date: PONV (postoperative nausea and vomiting) 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 No date: Raynaud's syndrome No date: Stroke [place])     Comment:  tia 2012: TIA (transient ischemic attack)  Past Surgical History: 1975: ABDOMINAL HYSTERECTOMY 1993, 2011: ANTERIOR CERVICAL DECOMP/DISCECTOMY FUSION     Comment:  C5-C6, C3-C4 No date: BACK SURGERY     Comment:  x4 2010: BLADDER SUSPENSION 1981: BREAST CYST EXCISION; Right     Comment:  neg 1980: BREAST EXCISIONAL BIOPSY; Left     Comment:  NEG 1980: BREAST LUMPECTOMY; Left     Comment:  benign 06/25/2015: CATARACT EXTRACTION  W/PHACO; Right     Comment:  Procedure: CATARACT EXTRACTION PHACO AND INTRAOCULAR               LENS PLACEMENT (IOC);  Surgeon: Galen Manila, MD;                Location: [facility] ORS;  Service: Ophthalmology;  Laterality:              Right;  US:00:54.9 AP:25.9 CDE:14.22 LOT PAK #[id number] H 07/16/2015: CATARACT EXTRACTION W/PHACO; Left     Comment:  Procedure: CATARACT EXTRACTION PHACO AND INTRAOCULAR               LENS PLACEMENT (IOC);  Surgeon: Galen Manila, MD;                Location: [facility] ORS;  Service: Ophthalmology;  Laterality:              Left;  [place]: 00:21.5 AP%: 20.1 CDE: 4.31 Lot # [id number] H 2014: EXCISIONAL HEMORRHOIDECTOMY 12/20/2018: JOINT REPLACEMENT     Comment:  right shoulder 1999: LUMBAR DISC SURGERY     Comment:  L4-L5 20014: POLYPECTOMY     Comment:  3 polyps removed from large intestines 12/20/2018: REVERSE SHOULDER ARTHROPLASTY; Right     Comment:  Procedure: REVERSE SHOULDER ARTHROPLASTY;  Surgeon:               Christena Flake, MD;  Location: [facility] ORS;  Service:               Orthopedics;  Laterality: Right; 1974: TUBAL LIGATION  Reproductive/Obstetrics negative OB ROS                             Anesthesia Physical  Anesthesia Plan  ASA: II  Anesthesia Plan: General   Post-op Pain Management:    Induction: Intravenous  PONV Risk Score and Plan: Dexamethasone, Ondansetron, Treatment may vary due to age or medical condition and Scopolamine patch - Pre-op  Airway Management Planned: LMA  Additional Equipment:   Intra-op Plan:   Post-operative Plan: Extubation in OR  Informed Consent: I have reviewed the patients History and Physical, chart, labs and discussed the procedure including the risks, benefits and alternatives for the proposed anesthesia with the patient or authorized representative who has indicated his/her understanding and acceptance.     Dental Advisory Given  Plan Discussed with:  Anesthesiologist  Anesthesia Plan Comments:         Anesthesia Quick Evaluation

## 2020-04-10 NOTE — Transfer of Care (Signed)
Immediate Anesthesia Transfer of Care Note  Patient: Kristin Stuart  Procedure(s) Performed: CARPAL TUNNEL RELEASE ENDOSCOPIC (Right Wrist)  Patient Location: PACU  Anesthesia Type:General  Level of Consciousness: awake, alert  and oriented  Airway & Oxygen Therapy: Patient Spontanous Breathing  Post-op Assessment: Report given to RN and Post -op Vital signs reviewed and stable  Post vital signs: Reviewed  Last Vitals:  Vitals Value Taken Time  BP 151/77 04/10/20 0946  Temp 36.1 C 04/10/20 0946  Pulse 84 04/10/20 0946  Resp 11 04/10/20 0946  SpO2 92 % 04/10/20 0946    Last Pain:  Vitals:   04/10/20 0700  TempSrc: Tympanic  PainSc: 0-No pain         Complications: No apparent anesthesia complications

## 2020-04-10 NOTE — Op Note (Signed)
04/10/2020  9:45 AM  Patient:   Kristin Stuart  Pre-Op Diagnosis:   Right carpal tunnel syndrome.  Post-Op Diagnosis:   Same.  Procedure:   Endoscopic right carpal tunnel release.  Surgeon:   Maryagnes Amos, MD  Anesthesia:   General LMA  Findings:   As above.  Complications:   None  EBL:   0 cc  Fluids:   500 cc crystalloid  TT:   10 minutes at 250 mmHg  Drains:   None  Closure:   4-0 Prolene interrupted sutures  Brief Clinical Note:   The patient is a 69 year old female with a history of progressively worsening pain and paresthesias to her right hand. Her symptoms have progressed despite medications, activity modification, splinting, etc. Her history and examination are consistent with carpal tunnel syndrome, confirmed by EMG. The patient presents at this time for an endoscopic right carpal tunnel release.   Procedure:   The patient was brought into the operating room and lain in the supine position. After adequate general laryngeal mask anesthesia was obtained, the right hand and upper extremity were prepped with ChloraPrep solution before being draped sterilely. Preoperative antibiotics were administered. A timeout was performed to verify the appropriate surgical site before the limb was exsanguinated with an Esmarch and the tourniquet inflated to 250 mmHg. An approximately 1.5-2 cm incision was made over the volar wrist flexion crease, centered over the palmaris longus tendon. The incision was carried down through the subcutaneous tissues with care taken to identify and protect any neurovascular structures. The distal forearm fascia was penetrated just proximal to the transverse carpal ligament. The soft tissues were released off the superficial and deep surfaces of the distal forearm fascia and this was released proximally for 3-4 cm under direct visualization.  Attention was directed distally. The Therapist, nutritional was passed beneath the transverse carpal ligament along the  ulnar aspect of the carpal tunnel and used to release any adhesions as well as to remove any adherent synovial tissue before first the smaller then the larger of the two dilators were passed beneath the transverse carpal ligament along the ulnar margin of the carpal tunnel. The slotted cannula was introduced and the endoscope was placed into the slotted cannula and the undersurface of the transverse carpal ligament visualized. The distal margin of the transverse carpal ligament was marked by placing a 25-gauge needle percutaneously at Kaplan's cardinal point so that it entered the distal portion of the slotted cannula. Under endoscopic visualization, the transverse carpal ligament was released from proximal to distal using the end-cutting blade. A second pass was performed to ensure complete release of the ligament. The adequacy of release was verified both endoscopically and by palpation using the freer elevator.  The wound was irrigated thoroughly with sterile saline solution before being closed using 4-0 Prolene interrupted sutures. A total of 10 cc of 0.5% plain Sensorcaine was injected in and around the incision before a sterile bulky dressing was applied to the wound. The patient was placed into a volar wrist splint before being awakened and returned to the recovery room in satisfactory condition after tolerating the procedure well.

## 2020-04-10 NOTE — Anesthesia Postprocedure Evaluation (Signed)
Anesthesia Post Note  Patient: Kristin Stuart  Procedure(s) Performed: CARPAL TUNNEL RELEASE ENDOSCOPIC (Right Wrist)  Patient location during evaluation: PACU Anesthesia Type: General Level of consciousness: awake and alert Pain management: pain level controlled Vital Signs Assessment: post-procedure vital signs reviewed and stable Respiratory status: spontaneous breathing, nonlabored ventilation, respiratory function stable and patient connected to nasal cannula oxygen Cardiovascular status: blood pressure returned to baseline and stable Postop Assessment: no apparent nausea or vomiting Anesthetic complications: no     Last Vitals:  Vitals:   04/10/20 1105 04/10/20 1232  BP: (!) 154/81 128/65  Pulse: 68 63  Resp: 13 14  Temp: (!) 36.1 C   SpO2: 97%     Last Pain:  Vitals:   04/10/20 1105  TempSrc: Temporal  PainSc: 4                  Lenard Simmer

## 2020-04-10 NOTE — Discharge Instructions (Addendum)
Orthopedic discharge instructions: Keep dressing dry and intact. Keep hand elevated above heart level. May shower after dressing removed on postop day 4 (Sunday). Cover sutures with Band-Aids after drying off. Apply ice to affected area frequently. Take Mobic 15 mg daily OR ibuprofen 600-800 mg TID with meals for 7-10 days, then as necessary.   TID = 3x per day (every 8 hours) Take ES Tylenol or pain medication as prescribed when needed.  Return for follow-up in 10-14 days or as scheduled.    AMBULATORY SURGERY  DISCHARGE INSTRUCTIONS   1) The drugs that you were given will stay in your system until tomorrow so for the next 24 hours you should not:  A) Drive an automobile B) Make any legal decisions C) Drink any alcoholic beverage   2) You may resume regular meals tomorrow.  Today it is better to start with liquids and gradually work up to solid foods.  You may eat anything you prefer, but it is better to start with liquids, then soup and crackers, and gradually work up to solid foods.   3) Please notify your doctor immediately if you have any unusual bleeding, trouble breathing, redness and pain at the surgery site, drainage, fever, or pain not relieved by medication.    4) Additional Instructions:        Please contact your physician with any problems or Same Day Surgery at [phone], Monday through Friday 6 am to 4 pm, or [place] at [facility] number at [phone].

## 2020-04-10 NOTE — H&P (Signed)
History of Present Illness: Kristin Stuart is a 69 y.o. who present today for a history and physical. She is to undergo endoscopic right carpal tunnel release on April 10, 2020. Had previous surgery on the left wrist recently and is done well. Still having symptoms on the right wrist. Still complaining of a lot of pain as well as night pain and paresthesia. Nerve conductions were performed which showed right moderate carpal tunnel syndrome. Due to the continuation of symptoms surgery has been recommended.  Past Medical History:  Abnormal mammogram  benign   Chest pain, atypical  normal stress test 2007, cardiac CT angiogram at [place] Oct 2008 showing very mild coronary disease   Chronic back pain  and neck   Chronic pain   Edema of both legs   Fibromyalgia   GERD (gastroesophageal reflux disease) 08/16/2007--EGD  hiatal hernia; repeat EGD 1/08 was normal   H/O reactive hypoglycemia   Hiatal hernia   Hypercholesterolemia   Hypercholesterolemia 04/25/2015   Hypertension   Low back pain   Neck pain   Pill dysphagia, unspecified  normal barium swallow 2010.   Plantar fasciitis   Plantar fasciitis   Prolapse of female bladder, acquired  s/p repair   Reactive hypoglycemia   Stasis dermatitis of both legs 10/17/2014   TIA (transient ischemic attack)   Unspecified hypothyroidism   Past Surgical History:  acdf  c3-c7, Dr. Tiana Loft 2008   ACDF 1993   BACK SURGERY   BREAST EXCISIONAL BIOPSY  benign   CYSTOPLASTY   Endoscopic left carpal tunnel release Left 03/07/2020  Dr.Gionni Freese   HEMORRHOIDECTOMY EXTERNAL   HYSTERECTOMY   lumbar discectomy surgery 2000   POSTERIOR FUSION LUMBAR SPINE 2000   Reverse Reverse TSA Right 12/20/2018  Flordia Kassem   SPINE SURGERY   Past Family History:  Lung cancer Father   Diabetes type II Sister   Coronary Artery Disease (Blocked arteries around heart) Maternal Grandmother   Hepatitis C Brother   Brain cancer  Daughter   Alcohol abuse Maternal Uncle   Cirrhosis Maternal Uncle   Other Paternal Aunt  ddd   Diabetes type II Paternal Grandmother   Current Outpatient Medications:  acetaminophen-codeine (TYLENOL #3) 300-30 mg tablet Take 1 tablet by mouth every 4 (four) hours as needed for Pain 90 tablet 5   amLODIPine (NORVASC) 5 MG tablet Take 1 tablet (5 mg total) by mouth once daily 90 tablet 4   aspirin 81 mg tablet Take 81 mg by mouth daily.   CARBAMIDE PEROXIDE (MURINE EAR WAX REMOVAL SYSTEM OTIC) Place in ear(s).   carboxymethylcellulose (REFRESH LIQUIGEL) 1 % ophthalmic solution Apply 1 drop to eye nightly   cetirizine (ZYRTEC) 10 MG tablet Take by mouth   cyclobenzaprine (FLEXERIL) 10 MG tablet Take 1 tablet (10 mg total) by mouth 3 (three) times daily 90 tablet 0   fluticasone propionate (FLONASE) 50 mcg/actuation nasal spray by Nasal route   hydroCHLOROthiazide (HYDRODIURIL) 25 MG tablet Take 1 tablet (25 mg total) by mouth once daily 90 tablet 11   HYDROcodone-acetaminophen (NORCO) 5-325 mg tablet Take 1 tablet by mouth every 6 (six) hours as needed 20 tablet 0   levothyroxine (SYNTHROID) 112 MCG tablet Take by mouth   meloxicam (MOBIC) 15 MG tablet Take 1 tablet (15 mg total) by mouth once daily 90 tablet 1   metoprolol succinate (TOPROL-XL) 50 MG XL tablet Take 1 tablet (50 mg total) by mouth once daily 90 tablet 0   simvastatin (ZOCOR) 20 MG tablet  Take 1 tablet (20 mg total) by mouth nightly 90 tablet 4   TRILEPTAL 300 mg tablet Take 1 tablet (300 mg total) by mouth 2 (two) times daily. 180 tablet 4   No current Epic-ordered facility-administered medications on file.   Allergies  Penicillins Other (See Comments)  Vaginal infection  Amlodipine Swelling and Other (See Comments)  Lower extremity swelling Ankle swelling   Review of Systems: A comprehensive 14 point ROS was performed, reviewed, and the pertinent orthopaedic findings are documented in the  HPI.  Physical Exam: BP 172/78   Pulse 70   Ht 161.3 cm (5' 3.5")   Wt 70.6 kg (155 lb 9.6 oz)   BMI 27.13 kg/m   General: Well-developed well-nourished female seen in no acute distress.   HEENT: Atraumatic,normocephalic. Pupils are equal and reactive to light. Oropharynx is clear with moist mucosa  Lungs: Clear to auscultation bilaterally   Cardiovascular: Regular rate and rhythm. Normal S1, S2. No murmurs. No appreciable gallops or rubs. Peripheral pulses are palpable.  Abdomen: Soft, non-tender, nondistended. Bowel sounds present  Extremity: Skin inspection of the right hand and wrist is notable for a very small mass on the volar aspect of her wrist measuring less than 0.5 cm (consistent with a volar carpal ganglion), but otherwise is unremarkable. No swelling, erythema, ecchymosis, abrasions, or other skin abnormalities are identified. She has no tenderness to palpation around the wrist or hand region. She exhibits full range of motion of her wrist both actively and passively, and is able to actively flex and extend all digits fully without any pain or triggering. She is neurovascularly intact to all digits, other than subjectively decreased sensation of light touch to the thumb, index, long, and ring fingers. She has a positive Phalen's test as well as a positive Tinel's test.  Neurological:  The patient is alert and oriented Sensation to light touch appears to be slightly diminished first 4 fingers. Gross motor strength appeared to be equal to 5/5  Vascular :  Peripheral pulses felt to be palpable. Capillary refill appears to be intact and within normal limits   EMG & NCV Findings: This is an abnormal electrodiagnostic exam consistent with 1) Right moderate (grade III) carpal tunnel syndrome (median nerve entrapment at wrist). 2) Left severe (grade IV) carpal tunnel syndrome (median nerve entrapment at wrist).   Impression: 1. Carpal tunnel syndrome right wrist 2.  Status post carpal tunnel release left wrist  Plan:  The treatment options were reviewed with the patient, including both surgical nonsurgical choices.  The patient and is frustrated by her symptoms and functional limitations, and would like to proceed with surgical intervention to include an endoscopic right carpal tunnel release.  This procedure has been discussed in detail with the patient, as have the potential risks (including bleeding, infection, nerve and/or blood vessel injury, persistent or recurrent pain/paresthesias, recurrence of carpal tunnel syndrome, weakness of grip, need for further surgery, blood clots, strokes, heart attacks and/or arrhythmias, etc.) and benefits.  The patient states her understanding and wishes to proceed.  A formal written consent will be obtained by the nursing staff.  The patient will return to clinic 7 to 10 days after surgery for suture removal.   H&P reviewed and patient re-examined. No changes.

## 2020-04-10 NOTE — Anesthesia Procedure Notes (Signed)
Procedure Name: LMA Insertion Performed by: Donavan Kerlin L, CRNA Pre-anesthesia Checklist: Patient identified, Patient being monitored, Timeout performed, Emergency Drugs available and Suction available Patient Re-evaluated:Patient Re-evaluated prior to induction Oxygen Delivery Method: Circle system utilized Preoxygenation: Pre-oxygenation with 100% oxygen Induction Type: IV induction Ventilation: Mask ventilation without difficulty LMA: LMA inserted LMA Size: 4.0 Tube type: Oral Number of attempts: 1 Placement Confirmation: positive ETCO2 and breath sounds checked- equal and bilateral Tube secured with: Tape Dental Injury: Teeth and Oropharynx as per pre-operative assessment        

## 2020-04-11 ENCOUNTER — Encounter: Payer: Self-pay | Admitting: Surgery

## 2020-04-24 ENCOUNTER — Other Ambulatory Visit: Payer: Self-pay

## 2020-04-24 ENCOUNTER — Emergency Department: Payer: Medicare HMO

## 2020-04-24 ENCOUNTER — Inpatient Hospital Stay
Admission: EM | Admit: 2020-04-24 | Discharge: 2020-04-24 | DRG: 683 | Discharge status: Against Medical Advice | Payer: Medicare HMO | Attending: Surgery | Admitting: Surgery

## 2020-04-24 DIAGNOSIS — Z87891 Personal history of nicotine dependence: Secondary | ICD-10-CM | POA: Diagnosis not present

## 2020-04-24 DIAGNOSIS — D649 Anemia, unspecified: Secondary | ICD-10-CM | POA: Diagnosis present

## 2020-04-24 DIAGNOSIS — M1990 Unspecified osteoarthritis, unspecified site: Secondary | ICD-10-CM | POA: Diagnosis present

## 2020-04-24 DIAGNOSIS — Z20822 Contact with and (suspected) exposure to covid-19: Secondary | ICD-10-CM | POA: Diagnosis present

## 2020-04-24 DIAGNOSIS — E875 Hyperkalemia: Secondary | ICD-10-CM | POA: Diagnosis present

## 2020-04-24 DIAGNOSIS — M797 Fibromyalgia: Secondary | ICD-10-CM | POA: Diagnosis present

## 2020-04-24 DIAGNOSIS — Z8673 Personal history of transient ischemic attack (TIA), and cerebral infarction without residual deficits: Secondary | ICD-10-CM

## 2020-04-24 DIAGNOSIS — E860 Dehydration: Secondary | ICD-10-CM | POA: Diagnosis present

## 2020-04-24 DIAGNOSIS — Z885 Allergy status to narcotic agent status: Secondary | ICD-10-CM

## 2020-04-24 DIAGNOSIS — Z880 Allergy status to penicillin: Secondary | ICD-10-CM | POA: Diagnosis not present

## 2020-04-24 DIAGNOSIS — R1011 Right upper quadrant pain: Secondary | ICD-10-CM

## 2020-04-24 DIAGNOSIS — Z79890 Hormone replacement therapy (postmenopausal): Secondary | ICD-10-CM | POA: Diagnosis not present

## 2020-04-24 DIAGNOSIS — Z7982 Long term (current) use of aspirin: Secondary | ICD-10-CM

## 2020-04-24 DIAGNOSIS — Z5329 Procedure and treatment not carried out because of patient's decision for other reasons: Secondary | ICD-10-CM | POA: Diagnosis present

## 2020-04-24 DIAGNOSIS — Z791 Long term (current) use of non-steroidal anti-inflammatories (NSAID): Secondary | ICD-10-CM

## 2020-04-24 DIAGNOSIS — Z825 Family history of asthma and other chronic lower respiratory diseases: Secondary | ICD-10-CM | POA: Diagnosis not present

## 2020-04-24 DIAGNOSIS — Z841 Family history of disorders of kidney and ureter: Secondary | ICD-10-CM

## 2020-04-24 DIAGNOSIS — K8060 Calculus of gallbladder and bile duct with cholecystitis, unspecified, without obstruction: Secondary | ICD-10-CM | POA: Diagnosis present

## 2020-04-24 DIAGNOSIS — Z888 Allergy status to other drugs, medicaments and biological substances status: Secondary | ICD-10-CM

## 2020-04-24 DIAGNOSIS — K8050 Calculus of bile duct without cholangitis or cholecystitis without obstruction: Secondary | ICD-10-CM | POA: Diagnosis present

## 2020-04-24 DIAGNOSIS — E871 Hypo-osmolality and hyponatremia: Secondary | ICD-10-CM | POA: Diagnosis present

## 2020-04-24 DIAGNOSIS — N179 Acute kidney failure, unspecified: Secondary | ICD-10-CM

## 2020-04-24 DIAGNOSIS — I10 Essential (primary) hypertension: Secondary | ICD-10-CM | POA: Diagnosis present

## 2020-04-24 DIAGNOSIS — K219 Gastro-esophageal reflux disease without esophagitis: Secondary | ICD-10-CM | POA: Diagnosis present

## 2020-04-24 DIAGNOSIS — F1290 Cannabis use, unspecified, uncomplicated: Secondary | ICD-10-CM | POA: Diagnosis present

## 2020-04-24 DIAGNOSIS — E039 Hypothyroidism, unspecified: Secondary | ICD-10-CM | POA: Diagnosis present

## 2020-04-24 LAB — COMPREHENSIVE METABOLIC PANEL
ALT: 13 U/L (ref 0–44)
AST: 24 U/L (ref 15–41)
Albumin: 4.8 g/dL (ref 3.5–5.0)
Alkaline Phosphatase: 68 U/L (ref 38–126)
Anion gap: 13 (ref 5–15)
BUN: 9 mg/dL (ref 8–23)
CO2: 27 mmol/L (ref 22–32)
Calcium: 9.4 mg/dL (ref 8.9–10.3)
Chloride: 86 mmol/L — ABNORMAL LOW (ref 98–111)
Creatinine, Ser: 1.32 mg/dL — ABNORMAL HIGH (ref 0.44–1.00)
GFR calc Af Amer: 47 mL/min — ABNORMAL LOW (ref >60)
GFR calc non Af Amer: 40 mL/min — ABNORMAL LOW (ref >60)
Glucose, Bld: 124 mg/dL — ABNORMAL HIGH (ref 70–99)
Potassium: 3 mmol/L — ABNORMAL LOW (ref 3.5–5.1)
Sodium: 126 mmol/L — ABNORMAL LOW (ref 135–145)
Total Bilirubin: 0.8 mg/dL (ref 0.3–1.2)
Total Protein: 8.1 g/dL (ref 6.5–8.1)

## 2020-04-24 LAB — CBC
HCT: 35.1 % — ABNORMAL LOW (ref 36.0–46.0)
Hemoglobin: 12.8 g/dL (ref 12.0–15.0)
MCH: 29.8 pg (ref 26.0–34.0)
MCHC: 36.5 g/dL — ABNORMAL HIGH (ref 30.0–36.0)
MCV: 81.6 fL (ref 80.0–100.0)
Platelets: 298 10*3/uL (ref 150–400)
RBC: 4.3 MIL/uL (ref 3.87–5.11)
RDW: 10.8 % — ABNORMAL LOW (ref 11.5–15.5)
WBC: 6.1 10*3/uL (ref 4.0–10.5)
nRBC: 0 % (ref 0.0–0.2)

## 2020-04-24 LAB — LIPASE, BLOOD: Lipase: 21 U/L (ref 11–51)

## 2020-04-24 MED ORDER — OXYCODONE-ACETAMINOPHEN 5-325 MG PO TABS
1.0000 | ORAL_TABLET | Freq: Once | ORAL | Status: AC
  Administered 2020-04-24: 1 via ORAL
  Filled 2020-04-24: qty 1

## 2020-04-24 MED ORDER — ONDANSETRON HCL 4 MG/2ML IJ SOLN
4.0000 mg | Freq: Once | INTRAMUSCULAR | Status: AC
  Administered 2020-04-24: 4 mg via INTRAVENOUS
  Filled 2020-04-24: qty 2

## 2020-04-24 MED ORDER — SODIUM CHLORIDE 0.9 % IV BOLUS
1000.0000 mL | Freq: Once | INTRAVENOUS | Status: AC
  Administered 2020-04-24: 1000 mL via INTRAVENOUS

## 2020-04-24 MED ORDER — KETOROLAC TROMETHAMINE 30 MG/ML IJ SOLN
15.0000 mg | Freq: Once | INTRAMUSCULAR | Status: AC
  Administered 2020-04-24: 15 mg via INTRAVENOUS
  Filled 2020-04-24: qty 1

## 2020-04-24 MED ORDER — ONDANSETRON 4 MG PO TBDP
4.0000 mg | ORAL_TABLET | Freq: Three times a day (TID) | ORAL | 0 refills | Status: DC | PRN
Start: 2020-04-24 — End: 2020-07-02

## 2020-04-24 NOTE — ED Notes (Signed)
MD Scotty Court at bedside to explain risk and provide instructions for follow up care.

## 2020-04-24 NOTE — Discharge Instructions (Addendum)
Your tests today showed low sodium and potassium levels due to dehydration.  This was also impairing your kidney function.  Your gallbladder ultrasound in concerning for developing infection of the gallbladder from the gallstones.  You decided to leave the hospital against medical advice, but you are welcome to return at any time if you change your mind and wish to continue treatment.  If you do not return to the ER, please follow up with Dr. Tonna Boehringer in surgery clinic this week for continued assessment.

## 2020-04-24 NOTE — ED Notes (Signed)
Pt refuses VS

## 2020-04-24 NOTE — ED Triage Notes (Signed)
Pt states that she has a sharp pain in the middle of her lower abd that started about 30 mins ago. Pt tearful in triage.

## 2020-04-24 NOTE — ED Provider Notes (Signed)
[facility] Emergency Department Provider Note  ____________________________________________  Time seen: Approximately 9:39 PM  I have reviewed the triage vital signs and the nursing notes.   HISTORY  Chief Complaint Abdominal Pain    HPI Kristin Stuart is a 69 y.o. female with a history of hypertension, hypothyroidism, TIA, cholelithiasis who comes to the ED complaining of generalized weakness, lightheadedness, right upper quadrant pain.  She states that she has had decreased appetite and low energy for the past week with poor oral intake.  She has not eaten since yesterday.  She has occasional right upper quadrant abdominal pain that is nonradiating without aggravating or alleviating factors, associated with nausea.  Denies fever.      Past Medical History:  Diagnosis Date  . Anemia   . Arthritis   . Carpal tunnel syndrome, bilateral   . Edema    LEGS/FEET  . Edema    legs/feet  . Fibromyalgia   . GERD (gastroesophageal reflux disease)   . History of hiatal hernia   . Hypercholesteremia   . Hypertension   . Hypothyroid   . Plantar fasciitis   . PONV (postoperative nausea and vomiting)   . Post laminectomy syndrome   . Raynaud's syndrome   . Stroke ([place])    tia  . TIA (transient ischemic attack) 2012     Patient Active Problem List   Diagnosis Date Noted  . Biliary colic 88/41/6606  . Paranoia ([place]) 03/28/2019  . 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 Bilateral occipital neuralgia 03/14/2015  . DDD (degenerative disc disease), lumbar 03/14/2015  . Facet syndrome, lumbar 03/14/2015  . DDD (degenerative disc disease), cervical 03/14/2015  . Cervical facet joint syndrome 03/14/2015  . Migraine 03/14/2015     Past Surgical History:  Procedure Laterality Date  . ABDOMINAL HYSTERECTOMY  1975  . ANTERIOR CERVICAL DECOMP/DISCECTOMY FUSION  1993, 2011   C5-C6, C3-C4  . BACK SURGERY     x4  . BLADDER  SUSPENSION  2010  . BREAST CYST EXCISION Right 1981   neg  . BREAST EXCISIONAL BIOPSY Left 1980   NEG  . BREAST LUMPECTOMY Left 1980   benign  . CARPAL TUNNEL RELEASE Left 03/07/2020   Procedure: CARPAL TUNNEL RELEASE ENDOSCOPIC;  Surgeon: Corky Mull, MD;  Location: [facility] ORS;  Service: Orthopedics;  Laterality: Left;  . CARPAL TUNNEL RELEASE Right 04/10/2020   Procedure: CARPAL TUNNEL RELEASE ENDOSCOPIC;  Surgeon: Corky Mull, MD;  Location: [facility] ORS;  Service: Orthopedics;  Laterality: Right;  . CATARACT EXTRACTION W/PHACO Right 06/25/2015   Procedure: CATARACT EXTRACTION PHACO AND INTRAOCULAR LENS PLACEMENT ([place]);  Surgeon: Birder Robson, MD;  Location: [facility] ORS;  Service: Ophthalmology;  Laterality: Right;  US:00:54.9 AP:25.9 CDE:14.22 LOT PAK #[id number]  . CATARACT EXTRACTION W/PHACO Left 07/16/2015   Procedure: CATARACT EXTRACTION PHACO AND INTRAOCULAR LENS PLACEMENT (IOC);  Surgeon: Birder Robson, MD;  Location: [facility] ORS;  Service: Ophthalmology;  Laterality: Left;  [place]: 00:21.5 AP%: 20.1 CDE: 4.31 Lot # [id number]  . EXCISIONAL HEMORRHOIDECTOMY  2014  . JOINT REPLACEMENT  12/20/2018   right shoulder  . LUMBAR DISC SURGERY  1999   L4-L5  . POLYPECTOMY  20014   3 polyps removed from large intestines  . REVERSE SHOULDER ARTHROPLASTY Right 12/20/2018   Procedure: REVERSE SHOULDER ARTHROPLASTY;  Surgeon: Corky Mull, MD;  Location: [facility] ORS;  Service: Orthopedics;  Laterality: Right;  . TUBAL LIGATION  1974     Prior to Admission medications  Medication Sig Start Date End Date Taking? Authorizing Provider  acetaminophen-codeine (TYLENOL #3) 300-30 MG tablet Take 1 tablet by mouth every 4 (four) hours as needed. 04/14/20  Yes [provider]  amLODipine (NORVASC) 5 MG tablet Take 5 mg by mouth daily.    Yes [provider]  aspirin EC 81 MG tablet Take 81 mg by mouth daily.   Yes [provider]  cetirizine (ZYRTEC) 10 MG tablet Take 10 mg by mouth  daily as needed for allergies.   Yes [provider]  fluticasone (FLONASE) 50 MCG/ACT nasal spray Place 1 spray into both nostrils daily as needed for allergies or rhinitis.   Yes [provider]  hydrochlorothiazide (HYDRODIURIL) 25 MG tablet Take 25 mg by mouth daily.   Yes [provider]  levothyroxine (SYNTHROID, LEVOTHROID) 112 MCG tablet Take 112 mcg by mouth See admin instructions. Take by mouth daily every Thursday - Tuesday   Yes [provider]  levothyroxine (SYNTHROID, LEVOTHROID) 125 MCG tablet Take 125 mcg by mouth every Wednesday.    Yes [provider]  meloxicam (MOBIC) 15 MG tablet Take 15 mg by mouth daily.   Yes [provider]  metoprolol succinate (TOPROL-XL) 50 MG 24 hr tablet Take 50 mg by mouth daily. Take with or immediately following a meal.   Yes [provider]  Oxcarbazepine (TRILEPTAL) 300 MG tablet Take 300 mg by mouth 2 (two) times daily.   Yes [provider]  simvastatin (ZOCOR) 20 MG tablet Take 20 mg by mouth at bedtime. 03/23/16 04/24/20 Yes [provider]  HYDROcodone-acetaminophen (NORCO) 5-325 MG tablet Take 1-2 tablets by mouth every 6 (six) hours as needed for moderate pain. MAXIMUM TOTAL ACETAMINOPHEN DOSE IS 4000 MG PER DAY Patient not taking: Reported on 04/24/2020 04/10/20   Poggi, Excell Seltzer, MD     Allergies Penicillins, Norvasc [amlodipine], and Vancomycin   Family History  Problem Relation Age of Onset  . Kidney disease Mother   . Varicose Veins Mother   . Cancer Father   . COPD Father   . Breast cancer Neg Hx     Social History Social History   Tobacco Use  . Smoking status: Former Smoker    Quit date: 03/13/1998    Years since quitting: 22.1  . Smokeless tobacco: Never Used  Vaping Use  . Vaping Use: Never used  Substance Use Topics  . Alcohol use: Yes    Alcohol/week: 2.0 standard drinks    Types: 2 Cans of beer per week    Comment: "Mikes"   . Drug use: Yes    Types: Marijuana    Review of Systems  Constitutional:   No fever or chills.  ENT:   No sore throat. No rhinorrhea. Cardiovascular:   No chest pain or syncope. Respiratory:   No dyspnea or cough. Gastrointestinal:   Positive as above for upper abdominal pain.  No vomiting or constipation. Musculoskeletal:   Negative for focal pain or swelling All other systems reviewed and are negative except as documented above in ROS and HPI.  ____________________________________________   PHYSICAL EXAM:  VITAL SIGNS: ED Triage Vitals  Enc Vitals Group     BP 04/24/20 1643 (!) 109/53     Pulse Rate 04/24/20 1643 70     Resp 04/24/20 1643 16     Temp 04/24/20 1643 98 F (36.7 C)     Temp Source 04/24/20 1643 Oral     SpO2 04/24/20 1643 97 %  Weight 04/24/20 1644 152 lb 1.9 oz (69 kg)     Height 04/24/20 1644 5\' 8"  (1.727 m)     Head Circumference --      Peak Flow --      Pain Score 04/24/20 1643 10     Pain Loc --      Pain Edu? --      Excl. in GC? --     Vital signs reviewed, nursing assessments reviewed.   Constitutional:   Alert and oriented. Non-toxic appearance. Eyes:   Conjunctivae are normal. EOMI. PERRL. ENT      Head:   Normocephalic and atraumatic.      Nose:   Normal      Mouth/Throat:   Dry mucous membranes      Neck:   No meningismus. Full ROM. Hematological/Lymphatic/Immunilogical:   No cervical lymphadenopathy. Cardiovascular:   RRR. Symmetric bilateral radial and DP pulses.  No murmurs. Cap refill less than 2 seconds. Respiratory:   Normal respiratory effort without tachypnea/retractions. Breath sounds are clear and equal bilaterally. No wheezes/rales/rhonchi. Gastrointestinal:   Soft with right upper quadrant tenderness. Non distended. There is no CVA tenderness.  No rebound, rigidity, or guarding.  Musculoskeletal:   Normal range of motion in all extremities. No joint effusions.  No lower extremity tenderness.  No edema. Neurologic:    Normal speech and language.  Motor grossly intact. No acute focal neurologic deficits are appreciated.  Skin:    Skin is warm, dry and intact. No rash noted.  No petechiae, purpura, or bullae.  ____________________________________________    LABS (pertinent positives/negatives) (all labs ordered are listed, but only abnormal results are displayed) Labs Reviewed  COMPREHENSIVE METABOLIC PANEL - Abnormal; Notable for the following components:      Result Value   Sodium 126 (*)    Potassium 3.0 (*)    Chloride 86 (*)    Glucose, Bld 124 (*)    Creatinine, Ser 1.32 (*)    GFR calc non Af Amer 40 (*)    GFR calc Af Amer 47 (*)    All other components within normal limits  CBC - Abnormal; Notable for the following components:   HCT 35.1 (*)    MCHC 36.5 (*)    RDW 10.8 (*)    All other components within normal limits  SARS CORONAVIRUS 2 BY RT PCR (HOSPITAL ORDER, PERFORMED IN [facility])  LIPASE, BLOOD  URINALYSIS, COMPLETE (UACMP) WITH MICROSCOPIC   ____________________________________________   EKG    ____________________________________________    RADIOLOGY  04/26/20 Abdomen Limited RUQ  Result Date: 04/24/2020 CLINICAL DATA:  Right upper quadrant pain x2 hours. EXAM: ULTRASOUND ABDOMEN LIMITED RIGHT UPPER QUADRANT COMPARISON:  None. FINDINGS: Gallbladder: Shadowing echogenic gallstones are seen within the gallbladder lumen. The largest measures approximately 1.3 cm. There is no evidence of gallbladder wall thickening (1.3 mm). A positive sonographic 04/26/2020 sign was noted by the sonographer. Common bile duct: Diameter: 4.5 mm Liver: No focal lesion identified. Within normal limits in parenchymal echogenicity. Portal vein is patent on color Doppler imaging with normal direction of blood flow towards the liver. Other: None. IMPRESSION: 1. Cholelithiasis with additional findings consistent with acute cholecystitis. Electronically Signed   By: Eulah Pont M.D.    On: 04/24/2020 18:23    ____________________________________________   PROCEDURES Procedures  ____________________________________________  DIFFERENTIAL DIAGNOSIS   Cholecystitis, choledocholithiasis, pancreatitis, dehydration, electrolyte abnormalities  CLINICAL IMPRESSION / ASSESSMENT AND PLAN / ED COURSE  Medications ordered in the ED:  Medications  oxyCODONE-acetaminophen (PERCOCET/ROXICET) 5-325 MG per tablet 1 tablet (1 tablet Oral Given 04/24/20 1654)  sodium chloride 0.9 % bolus 1,000 mL (1,000 mLs Intravenous New Bag/Given 04/24/20 2100)  ondansetron (ZOFRAN) injection 4 mg (4 mg Intravenous Given 04/24/20 2115)  ketorolac (TORADOL) 30 MG/ML injection 15 mg (15 mg Intravenous Given 04/24/20 2115)    Pertinent labs & imaging results that were available during my care of the patient were reviewed by me and considered in my medical decision making (see chart for details).  Kristin Stuart was evaluated in Emergency Department on 04/24/2020 for the symptoms described in the history of present illness. She was evaluated in the context of the global COVID-19 pandemic, which necessitated consideration that the patient might be at risk for infection with the SARS-CoV-2 virus that causes COVID-19. Institutional protocols and algorithms that pertain to the evaluation of patients at risk for COVID-19 are in a state of rapid change based on information released by regulatory bodies including the CDC and federal and state organizations. These policies and algorithms were followed during the patient's care in the ED.   Patient presents with upper abdominal pain with known history of gallstones, malaise nausea clinically dehydrated.  Will check labs and obtain ultrasound.  Clinical Course as of Apr 24 2138  Wed Apr 24, 2020  1915 Ultrasound results discussed with radiology, concern for cholecystitis with positive sonographic Eulah Pont despite absence of gallbladder wall thickening or pericholecystic  fluid.  Case discussed with surgery Dr. Tonna Boehringer who will evaluate.I will give IV Toradol and Zofran for pain and nausea relief.  IV saline bolus for hydration given hyponatremia hyperkalemia and elevated creatinine.   [PS]  2104 Dr. Tonna Boehringer will admit for hydration, correction of electrolyte abnormalities, surgical management   [PS]    Clinical Course User Index [PS] Sharman Cheek, MD     ____________________________________________   FINAL CLINICAL IMPRESSION(S) / ED DIAGNOSES    Final diagnoses:  Biliary colic  Hyponatremia  Dehydration  AKI (acute kidney injury) [facility])     ED Discharge Orders    None      Portions of this note were generated with dragon dictation software. Dictation errors may occur despite best attempts at proofreading.   Sharman Cheek, MD 04/24/20 2142

## 2020-04-24 NOTE — H&P (Signed)
Subjective:   CC: RUQ pain  HPI:  Kristin Stuart is a 69 y.o. female who is consulted by [facility] for evaluation of above cc.  Symptoms were first noted several weeks ago. Pain is sharp, episodic, three episodes recently.  Associated with decreased appetite, exacerbated by nothing specific.      Past Medical History:  has a past medical history of Anemia, Arthritis, Carpal tunnel syndrome, bilateral, Edema, Edema, Fibromyalgia, GERD (gastroesophageal reflux disease), History of hiatal hernia, Hypercholesteremia, Hypertension, Hypothyroid, Plantar fasciitis, PONV (postoperative nausea and vomiting), Post laminectomy syndrome, Raynaud's syndrome, Stroke ([place]), and TIA (transient ischemic attack) (2012).  Past Surgical History:  has a past surgical history that includes Lumbar disc surgery (1999); Anterior cervical decomp/discectomy fusion (1993, 2011); Bladder suspension (2010); Abdominal hysterectomy (1975); Tubal ligation (1974); Polypectomy (20014); Excisional hemorrhoidectomy (2014); Cataract extraction w/PHACO (Right, 06/25/2015); Back surgery; Cataract extraction w/PHACO (Left, 07/16/2015); Breast excisional biopsy (Left, 1980); Breast lumpectomy (Left, 1980); Breast cyst excision (Right, 1981); Reverse shoulder arthroplasty (Right, 12/20/2018); Joint replacement (12/20/2018); Carpal tunnel release (Left, 03/07/2020); and Carpal tunnel release (Right, 04/10/2020).  Family History: family history includes COPD in her father; Cancer in her father; Kidney disease in her mother; Varicose Veins in her mother.  Social History:  reports that she quit smoking about 22 years ago. She has never used smokeless tobacco. She reports current alcohol use of about 2.0 standard drinks of alcohol per week. She reports current drug use. Drug: Marijuana.  Current Medications:  amLODipine (NORVASC) 5 MG tablet Take 5 mg by mouth daily.  [provider] Needs Review  aspirin EC 81 MG tablet Take 81 mg by mouth  daily. [provider] Needs Review  cetirizine (ZYRTEC) 10 MG tablet Take 10 mg by mouth daily as needed for allergies. [provider] Needs Review  fluticasone (FLONASE) 50 MCG/ACT nasal spray Place 1 spray into both nostrils daily as needed for allergies or rhinitis. [provider] Needs Review  hydrochlorothiazide (HYDRODIURIL) 25 MG tablet Take 25 mg by mouth daily. [provider] Needs Review  HYDROcodone-acetaminophen (NORCO) 5-325 MG tablet Take 1-2 tablets by mouth every 6 (six) hours as needed for moderate pain. MAXIMUM TOTAL ACETAMINOPHEN DOSE IS 4000 MG PER DAY Poggi, Marshall Cork, MD Needs Review  levothyroxine (SYNTHROID, LEVOTHROID) 112 MCG tablet Take 112 mcg by mouth See admin instructions. Take 141mcg by mouth daily every Thursday - Tuesday [provider] Needs Review  levothyroxine (SYNTHROID, LEVOTHROID) 125 MCG tablet Take 125 mcg by mouth every Wednesday.  [provider] Needs Review  meloxicam (MOBIC) 15 MG tablet Take 15 mg by mouth daily. [provider] Needs Review  metoprolol succinate (TOPROL-XL) 50 MG 24 hr tablet Take 50 mg by mouth daily. Take with or immediately following a meal. [provider] Needs Review  Oxcarbazepine (TRILEPTAL) 300 MG tablet Take 300 mg by mouth 2 (two) times daily. [provider] Needs Review  simvastatin (ZOCOR) 20 MG tablet Take 20 mg by mouth at bedtime. [provider] Needs Review      Allergies:  Allergies as of 04/24/2020 - Review Complete 04/24/2020  Allergen Reaction Noted  . Penicillins Other (See Comments) 03/14/2015  . Norvasc [amlodipine] Swelling and Other (See Comments) 03/14/2015  . Vancomycin Rash 12/20/2018    ROS:  General: Denies weight loss, weight gain, fatigue, fevers, chills, and night sweats. Eyes: Denies blurry vision, double vision, eye pain, itchy eyes, and tearing. Ears: Denies hearing loss, earache, and ringing in  ears. Nose: Denies  sinus pain, congestion, infections, runny nose, and nosebleeds. Mouth/throat: Denies hoarseness, sore throat, bleeding gums, and difficulty swallowing. Heart: Denies chest pain, palpitations, racing heart, irregular heartbeat, leg pain or swelling, and decreased activity tolerance. Respiratory: Denies breathing difficulty, shortness of breath, wheezing, cough, and sputum. GI: Denies change, heartburn, nausea, vomiting, constipation, diarrhea, and blood in stool. GU: Denies difficulty urinating, pain with urinating, urgency, frequency, blood in urine. Musculoskeletal: Denies joint stiffness, pain, swelling, muscle weakness. Skin: Denies rash, itching, mass, tumors, sores, and boils Neurologic: Denies headache, fainting, dizziness, seizures, numbness, and tingling. Psychiatric: Denies depression, anxiety, difficulty sleeping, and memory loss. Endocrine: Denies heat or cold intolerance, and increased thirst or urination. Blood/lymph: Denies easy bruising, and swollen glands     Objective:     BP (!) 109/53 (BP Location: Right Arm)   Pulse 70   Temp 98 F (36.7 C) (Oral)   Resp 16   Ht 5\' 8"  (1.727 m)   Wt 69 kg   SpO2 97%   BMI 23.13 kg/m    Constitutional :  alert, cooperative, appears stated age and no distress  Lymphatics/Throat:  no asymmetry, masses, or scars  Respiratory:  clear to auscultation bilaterally  Cardiovascular:  regular rate and rhythm  Gastrointestinal: soft, no guarding, mild TTP in RUQ.   Musculoskeletal: Steady movement  Skin: Cool and moist  Psychiatric: Normal affect, non-agitated, not confused       LABS:  CMP Latest Ref Rng & Units 04/24/2020 06/21/2019 12/22/2018  Glucose 70 - 99 mg/dL 12/24/2018) 785(Y) 850(Y)  BUN 8 - 23 mg/dL 9 14 12   Creatinine 0.44 - 1.00 mg/dL 774(J) 2.87(O  Sodium 135 - 145 mmol/L 126(L) 130(L) 138  Potassium 3.5 - 5.1 mmol/L 3.0(L) 3.7 3.6  Chloride 98 - 111 mmol/L 86(L) 95(L) 103  CO2 22 - 32 mmol/L 27  26 28   Calcium 8.9 - 10.3 mg/dL 9.4 6.76) 7.20)  Total Protein 6.5 - 8.1 g/dL 8.1 7.5 -  Total Bilirubin 0.3 - 1.2 mg/dL 0.8 0.7 -  Alkaline Phos 38 - 126 U/L 68 76 -  AST 15 - 41 U/L 24 29 -  ALT 0 - 44 U/L 13 20 -   CBC Latest Ref Rng & Units 04/24/2020 03/05/2020 06/21/2019  WBC 4.0 - 10.5 K/uL 6.1 3.5(L) 3.5(L)  Hemoglobin 12.0 - 15.0 g/dL 04/26/2020 05/05/2020 06/23/2019  Hematocrit 36 - 46 % 35.1(L) 35.1(L) 36.3  Platelets 150 - 400 K/uL 298 279 251     RADS: CLINICAL DATA:  Right upper quadrant pain x2 hours.  EXAM: ULTRASOUND ABDOMEN LIMITED RIGHT UPPER QUADRANT  COMPARISON:  None.  FINDINGS: Gallbladder:  Shadowing echogenic gallstones are seen within the gallbladder lumen. The largest measures approximately 1.3 cm. There is no evidence of gallbladder wall thickening (1.3 mm). A positive sonographic 28.3 sign was noted by the sonographer.  Common bile duct:  Diameter: 4.5 mm  Liver:  No focal lesion identified. Within normal limits in parenchymal echogenicity. Portal vein is patent on color Doppler imaging with normal direction of blood flow towards the liver.  Other: None.  IMPRESSION: 1. Cholelithiasis with additional findings consistent with acute cholecystitis.   Electronically Signed   By: 66.2 M.D.   On: 04/24/2020 18:23  Assessment:      Biliary colic Hyponatremia Elevated creatinine Hx of marijuana use  Plan:      Discussed the risk of surgery including post-op infxn, seroma, biloma, chronic pain, poor-delayed wound healing, retained gallstone, conversion to open procedure,  post-op SBO or ileus, and need for additional procedures to address said risks.  The risks of general anesthetic including MI, CVA, sudden death or even reaction to anesthetic medications also discussed. Alternatives include continued observation.  Benefits include possible symptom relief, prevention of complications including acute cholecystitis,  pancreatitis.  Typical post operative recovery of 3-5 days rest, continued pain in area and incision sites, possible loose stools up to 4-6 weeks, also discussed.  The patient understands the risks, any and all questions were answered to the patient's satisfaction.  Admit for IVF, Na and Cr correction, and also allow aspirin to wear off.  Like surgery in a day or two depending on how she responds to fluids.  Antibiotics in the meantime. All questions addressed at this time.

## 2020-04-24 NOTE — ED Provider Notes (Signed)
Procedures  Clinical Course as of Apr 24 2228  Wed Apr 24, 2020  1915 Ultrasound results discussed with radiology, concern for cholecystitis with positive sonographic Eulah Pont despite absence of gallbladder wall thickening or pericholecystic fluid.  Case discussed with surgery Dr. Tonna Boehringer who will evaluate.I will give IV Toradol and Zofran for pain and nausea relief.  IV saline bolus for hydration given hyponatremia hyperkalemia and elevated creatinine.   [PS]  2104 Dr. Tonna Boehringer will admit for hydration, correction of electrolyte abnormalities, surgical management   [PS]    Clinical Course User Index [PS] Sharman Cheek, MD    ----------------------------------------- 10:29 PM on 04/24/2020 -----------------------------------------  Informed by nurse that the patient is now demanding to be discharged.  I attempted to discuss this with the patient myself, who is visibly angry.  She reiterates that she is determined to go home, and feels that having to wait in a hallway bed is degrading.  I discussed with her the findings of hyponatremia, hypokalemia, dehydration and AKI, my concern that her symptoms are going to cause dehydration and these abnormalities to worsen if her gallbladder disease is not treated.  The patient refuses to make eye contact or discuss it further.  There are no signs of psychosis or delirium.  She has medical decision-making capacity, we will honor her wishes and discharge her AGAINST MEDICAL ADVICE.  I am prescribing her Zofran to help manage her symptoms.  She is already on opioids long-term from primary care and Mobic.  I have informed her that she is welcome to return to the emergency department at any time if she changes her mind and wishes to continue treatment.  Final diagnoses:  Biliary colic  Hyponatremia  Dehydration  AKI (acute kidney injury) [facility])      Sharman Cheek, MD 04/24/20 2234

## 2020-04-24 NOTE — ED Notes (Addendum)
Pt yelling out in hallway. This Programme researcher, broadcasting/film/video at bedside. Pt sts, "I have to yell out for help. I have been sitting out here in this hallway and you tell me that I am going to have to stay down here all night? No, I feel like a slave. I am old! I didn't work all my life to come in here and be treated like this. You where in there in that room and you could have at least acknowledged me but you was too busy." This RN explained several times to pt for the delay due to a critical incident that this RN was in charge of.This RN also explained when IV placed and meds started that pt would be held in ED due to hospital capacity. Pt request to leave facility. MD and charge made aware. Pt educated on risk and that she would need to sign AMA forms. Forms explained. Pt still requesting to leave.

## 2020-04-24 NOTE — ED Triage Notes (Addendum)
First nurse note- here for weakness and feeling going to pass out.  NAD

## 2020-07-02 ENCOUNTER — Emergency Department
Admission: EM | Admit: 2020-07-02 | Discharge: 2020-07-02 | Disposition: A | Discharge status: Home / Self Care | Payer: Medicare HMO | Attending: Emergency Medicine | Admitting: Emergency Medicine

## 2020-07-02 ENCOUNTER — Encounter: Payer: Self-pay | Admitting: Emergency Medicine

## 2020-07-02 ENCOUNTER — Other Ambulatory Visit: Payer: Self-pay

## 2020-07-02 DIAGNOSIS — E039 Hypothyroidism, unspecified: Secondary | ICD-10-CM | POA: Diagnosis not present

## 2020-07-02 DIAGNOSIS — Z96651 Presence of right artificial knee joint: Secondary | ICD-10-CM | POA: Insufficient documentation

## 2020-07-02 DIAGNOSIS — I10 Essential (primary) hypertension: Secondary | ICD-10-CM | POA: Diagnosis not present

## 2020-07-02 DIAGNOSIS — Z8673 Personal history of transient ischemic attack (TIA), and cerebral infarction without residual deficits: Secondary | ICD-10-CM | POA: Diagnosis not present

## 2020-07-02 DIAGNOSIS — Z79890 Hormone replacement therapy (postmenopausal): Secondary | ICD-10-CM | POA: Diagnosis not present

## 2020-07-02 DIAGNOSIS — Z79899 Other long term (current) drug therapy: Secondary | ICD-10-CM | POA: Insufficient documentation

## 2020-07-02 DIAGNOSIS — E860 Dehydration: Secondary | ICD-10-CM

## 2020-07-02 DIAGNOSIS — Z7982 Long term (current) use of aspirin: Secondary | ICD-10-CM | POA: Insufficient documentation

## 2020-07-02 DIAGNOSIS — Z20822 Contact with and (suspected) exposure to covid-19: Secondary | ICD-10-CM | POA: Diagnosis not present

## 2020-07-02 DIAGNOSIS — Z87891 Personal history of nicotine dependence: Secondary | ICD-10-CM | POA: Diagnosis not present

## 2020-07-02 DIAGNOSIS — R300 Dysuria: Secondary | ICD-10-CM | POA: Diagnosis present

## 2020-07-02 DIAGNOSIS — N3090 Cystitis, unspecified without hematuria: Secondary | ICD-10-CM | POA: Diagnosis not present

## 2020-07-02 LAB — COMPREHENSIVE METABOLIC PANEL
ALT: 20 U/L (ref 0–44)
AST: 26 U/L (ref 15–41)
Albumin: 4.6 g/dL (ref 3.5–5.0)
Alkaline Phosphatase: 90 U/L (ref 38–126)
Anion gap: 12 (ref 5–15)
BUN: 11 mg/dL (ref 8–23)
CO2: 28 mmol/L (ref 22–32)
Calcium: 9.5 mg/dL (ref 8.9–10.3)
Chloride: 96 mmol/L — ABNORMAL LOW (ref 98–111)
Creatinine, Ser: 0.68 mg/dL (ref 0.44–1.00)
GFR calc Af Amer: >60 mL/min (ref >60)
GFR calc non Af Amer: >60 mL/min (ref >60)
Glucose, Bld: 121 mg/dL — ABNORMAL HIGH (ref 70–99)
Potassium: 3.2 mmol/L — ABNORMAL LOW (ref 3.5–5.1)
Sodium: 136 mmol/L (ref 135–145)
Total Bilirubin: 0.6 mg/dL (ref 0.3–1.2)
Total Protein: 8.3 g/dL — ABNORMAL HIGH (ref 6.5–8.1)

## 2020-07-02 LAB — LIPASE, BLOOD: Lipase: 75 U/L — ABNORMAL HIGH (ref 11–51)

## 2020-07-02 LAB — CBC
HCT: 35.1 % — ABNORMAL LOW (ref 36.0–46.0)
Hemoglobin: 12.5 g/dL (ref 12.0–15.0)
MCH: 29.8 pg (ref 26.0–34.0)
MCHC: 35.6 g/dL (ref 30.0–36.0)
MCV: 83.8 fL (ref 80.0–100.0)
Platelets: 405 10*3/uL — ABNORMAL HIGH (ref 150–400)
RBC: 4.19 MIL/uL (ref 3.87–5.11)
RDW: 11.3 % — ABNORMAL LOW (ref 11.5–15.5)
WBC: 5.6 10*3/uL (ref 4.0–10.5)
nRBC: 0 % (ref 0.0–0.2)

## 2020-07-02 LAB — URINALYSIS, COMPLETE (UACMP) WITH MICROSCOPIC
Bilirubin Urine: NEGATIVE
Glucose, UA: NEGATIVE mg/dL
Ketones, ur: NEGATIVE mg/dL
Nitrite: POSITIVE — AB
Protein, ur: 30 mg/dL — AB
Specific Gravity, Urine: 1.015 (ref 1.005–1.030)
WBC, UA: >50 WBC/hpf — ABNORMAL HIGH (ref 0–5)
pH: 5 (ref 5.0–8.0)

## 2020-07-02 LAB — LACTIC ACID, PLASMA
Lactic Acid, Venous: 2.2 mmol/L (ref 0.5–1.9)
Lactic Acid, Venous: 0.8 mmol/L (ref 0.5–1.9)

## 2020-07-02 LAB — SARS CORONAVIRUS 2 BY RT PCR (HOSPITAL ORDER, PERFORMED IN ~~LOC~~ HOSPITAL LAB): SARS Coronavirus 2: NEGATIVE

## 2020-07-02 MED ORDER — METOCLOPRAMIDE HCL 5 MG/ML IJ SOLN
10.0000 mg | Freq: Once | INTRAMUSCULAR | Status: AC
  Administered 2020-07-02: 10 mg via INTRAVENOUS
  Filled 2020-07-02: qty 2

## 2020-07-02 MED ORDER — SODIUM CHLORIDE 0.9 % IV BOLUS
1000.0000 mL | Freq: Once | INTRAVENOUS | Status: AC
  Administered 2020-07-02: 1000 mL via INTRAVENOUS

## 2020-07-02 MED ORDER — DIPHENHYDRAMINE HCL 50 MG/ML IJ SOLN
25.0000 mg | Freq: Once | INTRAMUSCULAR | Status: AC
  Administered 2020-07-02: 25 mg via INTRAVENOUS
  Filled 2020-07-02: qty 1

## 2020-07-02 MED ORDER — NITROFURANTOIN MONOHYD MACRO 100 MG PO CAPS
100.0000 mg | ORAL_CAPSULE | Freq: Once | ORAL | Status: AC
  Administered 2020-07-02: 100 mg via ORAL
  Filled 2020-07-02: qty 1

## 2020-07-02 MED ORDER — ONDANSETRON 4 MG PO TBDP: 4.0000 mg | ORAL_TABLET | Freq: Three times a day (TID) | ORAL | 0 refills | Status: DC | PRN

## 2020-07-02 MED ORDER — ACETAMINOPHEN-CODEINE #3 300-30 MG PO TABS
1.0000 | ORAL_TABLET | Freq: Once | ORAL | Status: AC
  Administered 2020-07-02: 1 via ORAL
  Filled 2020-07-02: qty 1

## 2020-07-02 MED ORDER — SODIUM CHLORIDE 0.9 % IV SOLN
1.0000 g | Freq: Once | INTRAVENOUS | Status: AC
  Administered 2020-07-02: 1 g via INTRAVENOUS
  Filled 2020-07-02: qty 10

## 2020-07-02 MED ORDER — NITROFURANTOIN MACROCRYSTAL 100 MG PO CAPS: 100.0000 mg | ORAL_CAPSULE | Freq: Two times a day (BID) | ORAL | 0 refills | Status: AC

## 2020-07-02 MED ORDER — FLUCONAZOLE 100 MG PO TABS: 200.0000 mg | ORAL_TABLET | Freq: Once | ORAL | 0 refills | Status: AC

## 2020-07-02 NOTE — ED Notes (Signed)
Gave pt urine cup. 

## 2020-07-02 NOTE — ED Notes (Signed)
See triage note, c/o urinary sx, burning with urination. Alert and oriented, clear speech Denies fevers

## 2020-07-02 NOTE — Discharge Instructions (Signed)
Your covid test was negative.  Continue drinking lots of fluids to stay hydrated and take the antibiotic (nitrofurantoin) as prescribed.

## 2020-07-02 NOTE — ED Notes (Signed)
Rainbow was sent with gray on ICE to lab.

## 2020-07-02 NOTE — ED Triage Notes (Addendum)
C/O burning with urination.  States initially symptoms improved, but in the past 5-6 days symptoms have worsened.  Has been taking AZO, but symptoms had not improved. Also c/o diarrhea since Friday.  Also c/o decreased urine output for the past 2 weeks.  AAOx3.  Skin warm and dry. NAD

## 2020-07-02 NOTE — ED Notes (Signed)
Pharmacy messaged for tylenol missing

## 2020-07-02 NOTE — ED Provider Notes (Signed)
[facility] Emergency Department Provider Note  ____________________________________________  Time seen: Approximately 4:43 PM  I have reviewed the triage vital signs and the nursing notes.   HISTORY  Chief Complaint Dysuria and Diarrhea    HPI Kristin Stuart is a 69 y.o. female with a history of hypertension hypothyroidism anemia and arthritis who comes to the ED complaining of dysuria and urinary frequency for the past 6 days.  She tried taking Azo and drinking extra water at home which did not relieve her symptoms.   Denies fever but does endorse chills as well as watery diarrhea for the past 3 days.  She works in a [employer] and notes that she has had coworker sick with Covid recently and others with viral symptoms.     Past Medical History:  Diagnosis Date  . Anemia   . Arthritis   . Carpal tunnel syndrome, bilateral   . Edema    LEGS/FEET  . Edema    legs/feet  . Fibromyalgia   . GERD (gastroesophageal reflux disease)   . History of hiatal hernia   . Hypercholesteremia   . Hypertension   . Hypothyroid   . Plantar fasciitis   . PONV (postoperative nausea and vomiting)   . Post laminectomy syndrome   . Raynaud's syndrome   . Stroke (HCC)    tia  . TIA (transient ischemic attack) 2012     Patient Active Problem List   Diagnosis Date Noted  . Biliary colic 04/24/2020  . Paranoia (HCC) 03/28/2019  . 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 Bilateral occipital neuralgia 03/14/2015  . DDD (degenerative disc disease), lumbar 03/14/2015  . Facet syndrome, lumbar 03/14/2015  . DDD (degenerative disc disease), cervical 03/14/2015  . Cervical facet joint syndrome 03/14/2015  . Migraine 03/14/2015     Past Surgical History:  Procedure Laterality Date  . ABDOMINAL HYSTERECTOMY  1975  . ANTERIOR CERVICAL DECOMP/DISCECTOMY FUSION  1993, 2011   C5-C6, C3-C4  . BACK SURGERY     x4  . BLADDER  SUSPENSION  2010  . BREAST CYST EXCISION Right 1981   neg  . BREAST EXCISIONAL BIOPSY Left 1980   NEG  . BREAST LUMPECTOMY Left 1980   benign  . CARPAL TUNNEL RELEASE Left 03/07/2020   Procedure: CARPAL TUNNEL RELEASE ENDOSCOPIC;  Surgeon: Christena Flake, MD;  Location: [facility] ORS;  Service: Orthopedics;  Laterality: Left;  . CARPAL TUNNEL RELEASE Right 04/10/2020   Procedure: CARPAL TUNNEL RELEASE ENDOSCOPIC;  Surgeon: Christena Flake, MD;  Location: [facility] ORS;  Service: Orthopedics;  Laterality: Right;  . CATARACT EXTRACTION W/PHACO Right 06/25/2015   Procedure: CATARACT EXTRACTION PHACO AND INTRAOCULAR LENS PLACEMENT (IOC);  Surgeon: Galen Manila, MD;  Location: [facility] ORS;  Service: Ophthalmology;  Laterality: Right;  US:00:54.9 AP:25.9 CDE:14.22 LOT PAK #[id number]  . CATARACT EXTRACTION W/PHACO Left 07/16/2015   Procedure: CATARACT EXTRACTION PHACO AND INTRAOCULAR LENS PLACEMENT (IOC);  Surgeon: Galen Manila, MD;  Location: [facility] ORS;  Service: Ophthalmology;  Laterality: Left;  [place]: 00:21.5 AP%: 20.1 CDE: 4.31 Lot # [id number]  . EXCISIONAL HEMORRHOIDECTOMY  2014  . JOINT REPLACEMENT  12/20/2018   right shoulder  . LUMBAR DISC SURGERY  1999   L4-L5  . POLYPECTOMY  20014   3 polyps removed from large intestines  . REVERSE SHOULDER ARTHROPLASTY Right 12/20/2018   Procedure: REVERSE SHOULDER ARTHROPLASTY;  Surgeon: Christena Flake, MD;  Location: [facility] ORS;  Service: Orthopedics;  Laterality: Right;  .  TUBAL LIGATION  1974     Prior to Admission medications   Medication Sig Start Date End Date Taking? Authorizing Provider  acetaminophen-codeine (TYLENOL #3) 300-30 MG tablet Take 1 tablet by mouth every 4 (four) hours as needed. 04/14/20   [provider]  amLODipine (NORVASC) 5 MG tablet Take 5 mg by mouth daily.     [provider]  aspirin EC 81 MG tablet Take 81 mg by mouth daily.    [provider]  cetirizine (ZYRTEC) 10 MG tablet Take 10 mg by mouth daily as  needed for allergies.    [provider]  fluconazole (DIFLUCAN) 100 MG tablet Take 2 tablets (200 mg total) by mouth once for 1 dose. 07/02/20 07/02/20  Sharman Cheek, MD  fluticasone [facility]) 50 MCG/ACT nasal spray Place 1 spray into both nostrils daily as needed for allergies or rhinitis.    [provider]  hydrochlorothiazide (HYDRODIURIL) 25 MG tablet Take 25 mg by mouth daily.    [provider]  HYDROcodone-acetaminophen (NORCO) 5-325 MG tablet Take 1-2 tablets by mouth every 6 (six) hours as needed for moderate pain. MAXIMUM TOTAL ACETAMINOPHEN DOSE IS 4000 MG PER DAY Patient not taking: Reported on 04/24/2020 04/10/20   Poggi, Excell Seltzer, MD  levothyroxine (SYNTHROID, LEVOTHROID) 112 MCG tablet Take 112 mcg by mouth See admin instructions. Take by mouth daily every Thursday - Tuesday    [provider]  levothyroxine (SYNTHROID, LEVOTHROID) 125 MCG tablet Take 125 mcg by mouth every Wednesday.     [provider]  meloxicam (MOBIC) 15 MG tablet Take 15 mg by mouth daily.    [provider]  metoprolol succinate (TOPROL-XL) 50 MG 24 hr tablet Take 50 mg by mouth daily. Take with or immediately following a meal.    [provider]  nitrofurantoin (MACRODANTIN) 100 MG capsule Take 1 capsule (100 mg total) by mouth 2 (two) times daily for 7 days. 07/02/20 07/09/20  Sharman Cheek, MD  ondansetron (ZOFRAN ODT) 4 MG disintegrating tablet Take 1 tablet (4 mg total) by mouth every 8 (eight) hours as needed for nausea or vomiting. 07/02/20   Sharman Cheek, MD  Oxcarbazepine (TRILEPTAL) 300 MG tablet Take 300 mg by mouth 2 (two) times daily.    [provider]  simvastatin (ZOCOR) 20 MG tablet Take 20 mg by mouth at bedtime. 03/23/16 04/24/20  [provider]  tiZANidine (ZANAFLEX) 2 MG tablet Take 2 mg by mouth 3 (three) times daily. 06/28/20   [provider]     Allergies Penicillins, Norvasc  [amlodipine], and Vancomycin   Family History  Problem Relation Age of Onset  . Kidney disease Mother   . Varicose Veins Mother   . Cancer Father   . COPD Father   . Breast cancer Neg Hx     Social History Social History   Tobacco Use  . Smoking status: Former Smoker    Quit date: 03/13/1998    Years since quitting: 22.3  . Smokeless tobacco: Never Used  Vaping Use  . Vaping Use: Never used  Substance Use Topics  . Alcohol use: Yes    Alcohol/week: 2.0 standard drinks    Types: 2 Cans of beer per week    Comment: "Mikes"  . Drug use: Yes    Types: Marijuana    Review of Systems  Constitutional:   No fever positive chills.  ENT:   No sore throat. No rhinorrhea. Cardiovascular:   No chest pain or  syncope. Respiratory:   No dyspnea or cough. Gastrointestinal:   Negative for abdominal pain or vomiting.  Positive diarrhea Musculoskeletal:   Negative for focal pain or swelling All other systems reviewed and are negative except as documented above in ROS and HPI.  ____________________________________________   PHYSICAL EXAM:  VITAL SIGNS: ED Triage Vitals  Enc Vitals Group     BP 07/02/20 0932 (!) 155/77     Pulse Rate 07/02/20 0932 95     Resp 07/02/20 0932 17     Temp 07/02/20 0932 98.7 F (37.1 C)     Temp Source 07/02/20 0932 Oral     SpO2 07/02/20 0932 96 %     Weight --      Height --      Head Circumference --      Peak Flow --      Pain Score 07/02/20 0954 0     Pain Loc --      Pain Edu? --      Excl. in GC? --     Vital signs reviewed, nursing assessments reviewed.   Constitutional:   Alert and oriented. Non-toxic appearance. Eyes:   Conjunctivae are normal. EOMI. PERRL. ENT      Head:   Normocephalic and atraumatic.      Nose:   Normal      Mouth/Throat:   Dry mucous membranes.      Neck:   No meningismus. Full ROM. Hematological/Lymphatic/Immunilogical:   No cervical lymphadenopathy. Cardiovascular:   RRR. Symmetric bilateral radial and  DP pulses.  No murmurs. Cap refill less than 2 seconds. Respiratory:   Normal respiratory effort without tachypnea/retractions. Breath sounds are clear and equal bilaterally. No wheezes/rales/rhonchi. Gastrointestinal:   Soft with suprapubic tenderness. Non distended. There is no CVA tenderness.  No rebound, rigidity, or guarding. Musculoskeletal:   Normal range of motion in all extremities. No joint effusions.  No lower extremity tenderness.  No edema. Neurologic:   Normal speech and language.  Motor grossly intact. No acute focal neurologic deficits are appreciated.  Skin:    Skin is warm, dry and intact. No rash noted.  No petechiae, purpura, or bullae.  ____________________________________________    LABS (pertinent positives/negatives) (all labs ordered are listed, but only abnormal results are displayed) Labs Reviewed  LIPASE, BLOOD - Abnormal; Notable for the following components:      Result Value   Lipase 75 (*)    All other components within normal limits  COMPREHENSIVE METABOLIC PANEL - Abnormal; Notable for the following components:   Potassium 3.2 (*)    Chloride 96 (*)    Glucose, Bld 121 (*)    Total Protein 8.3 (*)    All other components within normal limits  CBC - Abnormal; Notable for the following components:   HCT 35.1 (*)    RDW 11.3 (*)    Platelets 405 (*)    All other components within normal limits  URINALYSIS, COMPLETE (UACMP) WITH MICROSCOPIC - Abnormal; Notable for the following components:   Color, Urine YELLOW (*)    APPearance CLOUDY (*)    Hgb urine dipstick SMALL (*)    Protein, ur 30 (*)    Nitrite POSITIVE (*)    Leukocytes,Ua LARGE (*)    WBC, UA >50 (*)    Bacteria, UA MANY (*)    All other components within normal limits  LACTIC ACID, PLASMA - Abnormal; Notable for the following components:   Lactic Acid, Venous 2.2 (*)  All other components within normal limits  SARS CORONAVIRUS 2 BY RT PCR (HOSPITAL ORDER, PERFORMED IN [place]  HOSPITAL LAB)  LACTIC ACID, PLASMA   ____________________________________________   EKG    ____________________________________________    RADIOLOGY  No results found.  ____________________________________________   PROCEDURES Procedures  ____________________________________________  DIFFERENTIAL DIAGNOSIS   Dehydration, electrolyte normality, UTI, Covid, influenza-like illness  CLINICAL IMPRESSION / ASSESSMENT AND PLAN / ED COURSE  Medications ordered in the ED: Medications  sodium chloride 0.9 % bolus 1,000 mL (0 mLs Intravenous Stopped 07/02/20 1640)  cefTRIAXone (ROCEPHIN) 1 g in sodium chloride 0.9 % 100 mL IVPB (0 g Intravenous Stopped 07/02/20 1639)  nitrofurantoin (macrocrystal-monohydrate) (MACROBID) capsule 100 mg (100 mg Oral Given 07/02/20 1535)  metoCLOPramide (REGLAN) injection 10 mg (10 mg Intravenous Given 07/02/20 1536)  diphenhydrAMINE (BENADRYL) injection 25 mg (25 mg Intravenous Given 07/02/20 1539)  acetaminophen-codeine (TYLENOL #3) 300-30 MG per tablet 1 tablet (1 tablet Oral Given 07/02/20 1640)    Pertinent labs & imaging results that were available during my care of the patient were reviewed by me and considered in my medical decision making (see chart for details).  Kristin Stuart was evaluated in Emergency Department on 07/02/2020 for the symptoms described in the history of present illness. She was evaluated in the context of the global COVID-19 pandemic, which necessitated consideration that the patient might be at risk for infection with the SARS-CoV-2 virus that causes COVID-19. Institutional protocols and algorithms that pertain to the evaluation of patients at risk for COVID-19 are in a state of rapid change based on information released by regulatory bodies including the CDC and federal and state organizations. These policies and algorithms were followed during the patient's care in the ED.   Patient presents with multiple symptoms suggestive  of viral illness or Covid.  Also dysuria.  Labs unremarkable except for urinalysis very consistent with a UTI.  Patient is tolerating oral intake but would feel more comfortable with IV fluid hydration in the ED so ordered her a dose of ceftriaxone and 1 L fluid bolus for symptom relief.  Review of prior EMR shows that in the past she had a Enterococcus infection that was sensitive to Macrobid, so will prescribe nitrofurantoin for a week.  She also notes she tends to get yeast infections after antibiotics I will write her prescription for a dose of Diflucan to take in 1 week.  Clinical Course as of Jul 02 1649  Tue Jul 02, 2020  1648 Covid neg.    [PS]    Clinical Course User Index [PS] Sharman CheekStafford, Delshawn Stech, MD     ____________________________________________   FINAL CLINICAL IMPRESSION(S) / ED DIAGNOSES    Final diagnoses:  Cystitis  Dehydration     ED Discharge Orders         Ordered    nitrofurantoin (MACRODANTIN) 100 MG capsule  2 times daily        07/02/20 1642    ondansetron (ZOFRAN ODT) 4 MG disintegrating tablet  Every 8 hours PRN        07/02/20 1642    fluconazole (DIFLUCAN) 100 MG tablet   Once        07/02/20 1642          Portions of this note were generated with dragon dictation software. Dictation errors may occur despite best attempts at proofreading.   Sharman CheekStafford, Chaniah Cisse, MD 07/02/20 1650

## 2020-07-15 ENCOUNTER — Ambulatory Visit: Payer: Self-pay | Admitting: Surgery

## 2020-07-15 NOTE — H&P (View-Only) (Signed)
Subjective:   CC: Chronic cholecystitis [K81.1]  HPI:  Kristin Stuart is a 69 y.o. female who returns for above. Several more episodes consistent with biliary colic since last see in ED.  She never made f/u appt with [facility] after requesting it back in the summer time.  She is now ok with proceeding with surgery at [facility].  Past Medical History:  has a past medical history of Abnormal mammogram, Chest pain, atypical, Chronic back pain, Chronic pain, Edema of both legs, Fibromyalgia, GERD (gastroesophageal reflux disease) (08/16/2007--EGD), H/O reactive hypoglycemia, Hiatal hernia, Hypercholesterolemia, Hypercholesterolemia (04/25/2015), Hypertension, Low back pain, Neck pain, Pill dysphagia, unspecified, Plantar fasciitis, Plantar fasciitis, Prolapse of female bladder, acquired, Reactive hypoglycemia, Stasis dermatitis of both legs (10/17/2014), TIA (transient ischemic attack), and Unspecified hypothyroidism.  Past Surgical History:  has a past surgical history that includes acdf; Hysterectomy; Breast excisional biopsy; Cystoplasty; Hemorrhoidectomy External; Back surgery; Posterior fusion lumbar spine (2000); Reverse Reverse TSA (Right, 12/20/2018); Spine surgery; ACDF (1993); lumbar discectomy surgery (2000); Endoscopic left carpal tunnel release (Left, 03/07/2020); and Endoscopic right carpal tunnel release (Right, 04/10/2020).  Family History: family history includes Alcohol abuse in her maternal uncle; Brain cancer in her daughter; Cirrhosis in her maternal uncle; Coronary Artery Disease (Blocked arteries around heart) in her maternal grandmother; Diabetes type II in her paternal grandmother and sister; Hepatitis C in her brother; Lung cancer in her father; Other in her paternal aunt.  Social History:  reports that she quit smoking about 21 years ago. She has never used smokeless tobacco. She reports current alcohol use of about 1.0 standard drinks of alcohol per week. She reports that she  does not use drugs.  Current Medications: has a current medication list which includes the following prescription(s): acetaminophen-codeine, amlodipine, aspirin, carbamide peroxide, carboxymethylcellulose, cetirizine, fluticasone propionate, hydrochlorothiazide, levothyroxine, meloxicam, metoprolol succinate, simvastatin, tizanidine, and trileptal.  Allergies:       Allergies as of 07/15/2020 - Reviewed 07/15/2020  Allergen Reaction Noted  . Penicillins Other (See Comments) 03/14/2015  . Amlodipine Swelling and Other (See Comments) 03/14/2015    ROS:  A 15 point review of systems was performed and pertinent positives and negatives noted in HPI    Objective:   BP 178/81   Pulse 89   Ht 161.3 cm (5' 3.5")   Wt 67.6 kg (149 lb)   BMI 25.98 kg/m    Constitutional :  alert, appears stated age, cooperative and no distress  Lymphatics/Throat:  no asymmetry, masses, or scars  Respiratory:  clear to auscultation bilaterally  Cardiovascular:  regular rate and rhythm  Gastrointestinal: soft, non-tender; bowel sounds normal; no masses,  no organomegaly.    Musculoskeletal: Steady gait and movement  Skin: Cool and moist  Psychiatric: Normal affect, non-agitated, not confused       LABS:  n/a   RADS: N/a  Assessment:      Chronic cholecystitis [K81.1]  Plan:   1. Chronic cholecystitis [K81.1] Discussed the risks of surgery again extensively including post-op infxn, seroma, biloma, chronic pain, poor-delayed wound healing, retained gallstone, conversion to open procedure, post-op SBO or ileus, and need for additional procedures to address said risks.  The risks of general anesthetic including MI, CVA, sudden death or even reaction to anesthetic medications also discussed. Alternatives include continued observation.  Benefits include possible symptom relief, prevention of complications including acute cholecystitis, pancreatitis.  Typical post operative recovery of  3-5 days rest, continued pain in area and incision sites, possible loose stools up to 4-6 weeks,  also discussed.  ED return precautions given for sudden increase in RUQ pain, with possible accompanying fever, nausea, and/or vomiting.  The patient understands the risks, any and all questions were answered to the patient's satisfaction.  2. Patient has elected to proceed with surgical treatment. Procedure will be scheduled.  Written consent was obtained.robotic assisted laparoscopic.  Pt has concerns for hx of hematoma with past surgeries. I explained to her it is unlikely with typical EBL from GB surgery, but will obtain plt and INR count to ensure nothing can done preop to minimize risk further.     

## 2020-07-15 NOTE — H&P (Signed)
Subjective:   CC: Chronic cholecystitis [K81.1]  HPI:  Kristin Stuart is a 69 y.o. female who returns for above. Several more episodes consistent with biliary colic since last see in ED.  She never made f/u appt with [facility] after requesting it back in the summer time.  She is now ok with proceeding with surgery at [facility].  Past Medical History:  has a past medical history of Abnormal mammogram, Chest pain, atypical, Chronic back pain, Chronic pain, Edema of both legs, Fibromyalgia, GERD (gastroesophageal reflux disease) (08/16/2007--EGD), H/O reactive hypoglycemia, Hiatal hernia, Hypercholesterolemia, Hypercholesterolemia (04/25/2015), Hypertension, Low back pain, Neck pain, Pill dysphagia, unspecified, Plantar fasciitis, Plantar fasciitis, Prolapse of female bladder, acquired, Reactive hypoglycemia, Stasis dermatitis of both legs (10/17/2014), TIA (transient ischemic attack), and Unspecified hypothyroidism.  Past Surgical History:  has a past surgical history that includes acdf; Hysterectomy; Breast excisional biopsy; Cystoplasty; Hemorrhoidectomy External; Back surgery; Posterior fusion lumbar spine (2000); Reverse Reverse TSA (Right, 12/20/2018); Spine surgery; ACDF (1993); lumbar discectomy surgery (2000); Endoscopic left carpal tunnel release (Left, 03/07/2020); and Endoscopic right carpal tunnel release (Right, 04/10/2020).  Family History: family history includes Alcohol abuse in her maternal uncle; Brain cancer in her daughter; Cirrhosis in her maternal uncle; Coronary Artery Disease (Blocked arteries around heart) in her maternal grandmother; Diabetes type II in her paternal grandmother and sister; Hepatitis C in her brother; Lung cancer in her father; Other in her paternal aunt.  Social History:  reports that she quit smoking about 21 years ago. She has never used smokeless tobacco. She reports current alcohol use of about 1.0 standard drinks of alcohol per week. She reports that she  does not use drugs.  Current Medications: has a current medication list which includes the following prescription(s): acetaminophen-codeine, amlodipine, aspirin, carbamide peroxide, carboxymethylcellulose, cetirizine, fluticasone propionate, hydrochlorothiazide, levothyroxine, meloxicam, metoprolol succinate, simvastatin, tizanidine, and trileptal.  Allergies:       Allergies as of 07/15/2020 - Reviewed 07/15/2020  Allergen Reaction Noted  . Penicillins Other (See Comments) 03/14/2015  . Amlodipine Swelling and Other (See Comments) 03/14/2015    ROS:  A 15 point review of systems was performed and pertinent positives and negatives noted in HPI    Objective:   BP 178/81   Pulse 89   Ht 161.3 cm (5' 3.5")   Wt 67.6 kg (149 lb)   BMI 25.98 kg/m    Constitutional :  alert, appears stated age, cooperative and no distress  Lymphatics/Throat:  no asymmetry, masses, or scars  Respiratory:  clear to auscultation bilaterally  Cardiovascular:  regular rate and rhythm  Gastrointestinal: soft, non-tender; bowel sounds normal; no masses,  no organomegaly.    Musculoskeletal: Steady gait and movement  Skin: Cool and moist  Psychiatric: Normal affect, non-agitated, not confused       LABS:  n/a   RADS: N/a  Assessment:      Chronic cholecystitis [K81.1]  Plan:   1. Chronic cholecystitis [K81.1] Discussed the risks of surgery again extensively including post-op infxn, seroma, biloma, chronic pain, poor-delayed wound healing, retained gallstone, conversion to open procedure, post-op SBO or ileus, and need for additional procedures to address said risks.  The risks of general anesthetic including MI, CVA, sudden death or even reaction to anesthetic medications also discussed. Alternatives include continued observation.  Benefits include possible symptom relief, prevention of complications including acute cholecystitis, pancreatitis.  Typical post operative recovery of  3-5 days rest, continued pain in area and incision sites, possible loose stools up to 4-6 weeks,  also discussed.  ED return precautions given for sudden increase in RUQ pain, with possible accompanying fever, nausea, and/or vomiting.  The patient understands the risks, any and all questions were answered to the patient's satisfaction.  2. Patient has elected to proceed with surgical treatment. Procedure will be scheduled.  Written consent was obtained.robotic assisted laparoscopic.  Pt has concerns for hx of hematoma with past surgeries. I explained to her it is unlikely with typical EBL from GB surgery, but will obtain plt and INR count to ensure nothing can done preop to minimize risk further.

## 2020-07-17 ENCOUNTER — Other Ambulatory Visit
Admission: RE | Admit: 2020-07-17 | Discharge: 2020-07-17 | Disposition: A | Discharge status: Home / Self Care | Payer: Medicare HMO | Source: Ambulatory Visit | Attending: Surgery | Admitting: Surgery

## 2020-07-17 ENCOUNTER — Encounter
Admission: RE | Admit: 2020-07-17 | Discharge: 2020-07-17 | Disposition: A | Discharge status: Home / Self Care | Payer: Medicare HMO | Source: Ambulatory Visit | Attending: Surgery | Admitting: Surgery

## 2020-07-17 ENCOUNTER — Other Ambulatory Visit: Payer: Self-pay

## 2020-07-17 DIAGNOSIS — Z20822 Contact with and (suspected) exposure to covid-19: Secondary | ICD-10-CM | POA: Insufficient documentation

## 2020-07-17 DIAGNOSIS — Z01812 Encounter for preprocedural laboratory examination: Secondary | ICD-10-CM | POA: Insufficient documentation

## 2020-07-17 HISTORY — DX: Headache, unspecified: R51.9

## 2020-07-17 LAB — CBC WITH DIFFERENTIAL/PLATELET
Abs Immature Granulocytes: 0.01 10*3/uL (ref 0.00–0.07)
Basophils Absolute: 0 10*3/uL (ref 0.0–0.1)
Basophils Relative: 0 %
Eosinophils Absolute: 0.2 10*3/uL (ref 0.0–0.5)
Eosinophils Relative: 6 %
HCT: 32.8 % — ABNORMAL LOW (ref 36.0–46.0)
Hemoglobin: 11.2 g/dL — ABNORMAL LOW (ref 12.0–15.0)
Immature Granulocytes: 0 %
Lymphocytes Relative: 31 %
Lymphs Abs: 1 10*3/uL (ref 0.7–4.0)
MCH: 29 pg (ref 26.0–34.0)
MCHC: 34.1 g/dL (ref 30.0–36.0)
MCV: 85 fL (ref 80.0–100.0)
Monocytes Absolute: 0.6 10*3/uL (ref 0.1–1.0)
Monocytes Relative: 19 %
Neutro Abs: 1.4 10*3/uL — ABNORMAL LOW (ref 1.7–7.7)
Neutrophils Relative %: 44 %
Platelets: 303 10*3/uL (ref 150–400)
RBC: 3.86 MIL/uL — ABNORMAL LOW (ref 3.87–5.11)
RDW: 11.6 % (ref 11.5–15.5)
WBC: 3.2 10*3/uL — ABNORMAL LOW (ref 4.0–10.5)
nRBC: 0 % (ref 0.0–0.2)

## 2020-07-17 LAB — BASIC METABOLIC PANEL
Anion gap: 12 (ref 5–15)
BUN: 14 mg/dL (ref 8–23)
CO2: 26 mmol/L (ref 22–32)
Calcium: 9.2 mg/dL (ref 8.9–10.3)
Chloride: 94 mmol/L — ABNORMAL LOW (ref 98–111)
Creatinine, Ser: 0.89 mg/dL (ref 0.44–1.00)
GFR calc Af Amer: >60 mL/min (ref >60)
GFR calc non Af Amer: >60 mL/min (ref >60)
Glucose, Bld: 94 mg/dL (ref 70–99)
Potassium: 4 mmol/L (ref 3.5–5.1)
Sodium: 132 mmol/L — ABNORMAL LOW (ref 135–145)

## 2020-07-17 LAB — PROTIME-INR
INR: 1 (ref 0.8–1.2)
Prothrombin Time: 12.4 seconds (ref 11.4–15.2)

## 2020-07-17 NOTE — Progress Notes (Addendum)
  [facility] Perioperative Services: Pre-Admission/Anesthesia Testing  Abnormal Lab Notification   Date: 07/17/20  Name: Kristin Stuart MRN:   [id number]  Re: Abnormal labs noted during PAT appointment   Provider(s) Notified: Sung Amabile, DO Notification mode: Routed and/or faxed via [facility]   ABNORMAL LAB VALUE(S): Lab Results  Component Value Date   NA 132 (L) 07/17/2020    Notes:  Patient on thiazide diuretic and oxcarbazepine daily, both of which could account for the mild hyponatremia noted on PAT labs today. Patient is scheduled for a XI ROBOTIC ASSISTED LAPAROSCOPIC CHOLECYSTECTOMY (N/A Abdomen) on 07/19/2020. Will send to primary attending surgeon for review. Order placed to have SDS staff recheck bedside NA level on the day of surgery. This is a Personal assistant; no formal response is required.  Quentin Mulling, MSN, APRN, FNP-C, CEN [facility]  Peri-operative Services Nurse Practitioner Phone: [phone] 07/17/20 3:32 PM

## 2020-07-17 NOTE — Patient Instructions (Addendum)
Your procedure is scheduled on:  Friday 07/19/20.  Report to DAY SURGERY DEPARTMENT LOCATED ON 2ND FLOOR MEDICAL MALL ENTRANCE. To find out your arrival time please call [phone] between 1PM - 3PM on Thursday 07/18/20.   Remember: Instructions that are not followed completely may result in serious medical risk, up to and including death, or upon the discretion of your surgeon and anesthesiologist your surgery may need to be rescheduled.     __X__ 1. Do not eat food after midnight the night before your procedure.                 No gum chewing or hard candies. You may drink clear liquids up to 2 hours                 before you are scheduled to arrive for your surgery- DO NOT drink clear                 liquids within 2 hours of the start of your surgery.                 Clear Liquids include:  water, apple juice without pulp, clear carbohydrate                 drink such as Clearfast or Gatorade, Black Coffee or Tea (Do not add                 milk or creamer to coffee or tea).  __X__2.  On the morning of surgery brush your teeth with toothpaste and water, you may rinse your mouth with mouthwash if you wish.  Do not swallow any toothpaste or mouthwash.    __X__ 3.  No Alcohol for 24 hours before or after surgery.  __X__ 4.  Do Not Smoke or use e-cigarettes For 24 Hours Prior to Your Surgery.                 Do not use any chewable tobacco products for at least 6 hours prior to                 surgery.  __X__5.  Notify your doctor if there is any change in your medical condition      (cold, fever, infections).      Do NOT wear jewelry, make-up, hairpins, clips or nail polish. Do NOT wear lotions, powders, or perfumes.  Do NOT shave 48 hours prior to surgery. Men may shave face and neck. Do NOT bring valuables to the hospital.     [facility] is not responsible for any belongings or valuables.   Contacts, dentures/partials or body piercings may not be worn into surgery. Bring  a case for your contacts, glasses or hearing aids, a denture cup will be supplied.    Patients discharged the day of surgery will not be allowed to drive home.     __X__ Take these medicines the morning of surgery with A SIP OF WATER:     1. amLODipine (NORVASC)   2. levothyroxine (SYNTHROID, LEVOTHROID)  3. metoprolol succinate (TOPROL-XL)  4. Oxcarbazepine (TRILEPTAL)   5. acetaminophen-codeine (TYLENOL #3) if needed  6. tiZANidine (ZANAFLEX) if needed       __X__ Use CHG Soap as directed  __X__ Stop Anti-inflammatories 7 days before surgery such as Advil, Ibuprofen, Motrin, BC or Goodies Powder, Naprosyn, Naproxen, Aleve, Aspirin, Meloxicam. May take Tylenol if needed for pain or discomfort.   __X__Do not start taking any new herbal  supplements or vitamins prior to your procedure.    Wear comfortable clothing (specific to your surgery type) to the hospital.  Plan for stool softeners for home use; pain medications have a tendency to cause constipation. You can also help prevent constipation by eating foods high in fiber such as fruits and vegetables and drinking plenty of fluids as your diet allows.  After surgery, you can prevent lung complications by doing breathing exercises.Take deep breaths and cough every 1-2 hours. Your doctor may order a device called an Incentive Spirometer to help you take deep breaths.  Please call the Pre-Admissions Testing Department at [phone] if you have any questions about these instructions

## 2020-07-18 LAB — SARS CORONAVIRUS 2 (TAT 6-24 HRS): SARS Coronavirus 2: NEGATIVE

## 2020-07-18 MED ORDER — INDOCYANINE GREEN 25 MG IV SOLR
1.2500 mg | Freq: Once | INTRAVENOUS | Status: AC
  Administered 2020-07-19: 1.25 mg via INTRAVENOUS
  Filled 2020-07-18: qty 0.5

## 2020-07-19 ENCOUNTER — Ambulatory Visit: Payer: Medicare HMO | Admitting: Urgent Care

## 2020-07-19 ENCOUNTER — Other Ambulatory Visit: Payer: Self-pay

## 2020-07-19 ENCOUNTER — Encounter: Payer: Self-pay | Admitting: Surgery

## 2020-07-19 ENCOUNTER — Observation Stay
Admission: RE | Admit: 2020-07-19 | Discharge: 2020-07-22 | Disposition: A | Discharge status: Home / Self Care | Payer: Medicare HMO | Attending: Surgery | Admitting: Surgery

## 2020-07-19 ENCOUNTER — Encounter
Admission: RE | Disposition: A | Discharge status: Home / Self Care | Payer: Self-pay | Source: Home / Self Care | Attending: Surgery

## 2020-07-19 DIAGNOSIS — Z7982 Long term (current) use of aspirin: Secondary | ICD-10-CM | POA: Insufficient documentation

## 2020-07-19 DIAGNOSIS — M797 Fibromyalgia: Secondary | ICD-10-CM | POA: Diagnosis not present

## 2020-07-19 DIAGNOSIS — Z87891 Personal history of nicotine dependence: Secondary | ICD-10-CM | POA: Diagnosis not present

## 2020-07-19 DIAGNOSIS — K219 Gastro-esophageal reflux disease without esophagitis: Secondary | ICD-10-CM | POA: Insufficient documentation

## 2020-07-19 DIAGNOSIS — Z888 Allergy status to other drugs, medicaments and biological substances status: Secondary | ICD-10-CM | POA: Diagnosis not present

## 2020-07-19 DIAGNOSIS — I10 Essential (primary) hypertension: Secondary | ICD-10-CM | POA: Diagnosis not present

## 2020-07-19 DIAGNOSIS — Z23 Encounter for immunization: Secondary | ICD-10-CM | POA: Insufficient documentation

## 2020-07-19 DIAGNOSIS — K811 Chronic cholecystitis: Secondary | ICD-10-CM | POA: Diagnosis present

## 2020-07-19 DIAGNOSIS — E039 Hypothyroidism, unspecified: Secondary | ICD-10-CM | POA: Insufficient documentation

## 2020-07-19 DIAGNOSIS — G8929 Other chronic pain: Secondary | ICD-10-CM | POA: Insufficient documentation

## 2020-07-19 DIAGNOSIS — Z9049 Acquired absence of other specified parts of digestive tract: Secondary | ICD-10-CM

## 2020-07-19 DIAGNOSIS — K8050 Calculus of bile duct without cholangitis or cholecystitis without obstruction: Secondary | ICD-10-CM

## 2020-07-19 DIAGNOSIS — Z880 Allergy status to penicillin: Secondary | ICD-10-CM | POA: Diagnosis not present

## 2020-07-19 DIAGNOSIS — Z79899 Other long term (current) drug therapy: Secondary | ICD-10-CM | POA: Insufficient documentation

## 2020-07-19 LAB — CBC
HCT: 32.8 % — ABNORMAL LOW (ref 36.0–46.0)
Hemoglobin: 11.1 g/dL — ABNORMAL LOW (ref 12.0–15.0)
MCH: 29.5 pg (ref 26.0–34.0)
MCHC: 33.8 g/dL (ref 30.0–36.0)
MCV: 87.2 fL (ref 80.0–100.0)
Platelets: 230 10*3/uL (ref 150–400)
RBC: 3.76 MIL/uL — ABNORMAL LOW (ref 3.87–5.11)
RDW: 11.5 % (ref 11.5–15.5)
WBC: 4.3 10*3/uL (ref 4.0–10.5)
nRBC: 0 % (ref 0.0–0.2)

## 2020-07-19 LAB — POCT I-STAT, CHEM 8
BUN: 15 mg/dL (ref 8–23)
Calcium, Ion: 1.17 mmol/L (ref 1.15–1.40)
Chloride: 97 mmol/L — ABNORMAL LOW (ref 98–111)
Creatinine, Ser: 0.9 mg/dL (ref 0.44–1.00)
Glucose, Bld: 111 mg/dL — ABNORMAL HIGH (ref 70–99)
HCT: 36 % (ref 36.0–46.0)
Hemoglobin: 12.2 g/dL (ref 12.0–15.0)
Potassium: 3.9 mmol/L (ref 3.5–5.1)
Sodium: 136 mmol/L (ref 135–145)
TCO2: 26 mmol/L (ref 22–32)

## 2020-07-19 LAB — URINE DRUG SCREEN, QUALITATIVE (ARMC ONLY)
Amphetamines, Ur Screen: NOT DETECTED
Barbiturates, Ur Screen: NOT DETECTED
Benzodiazepine, Ur Scrn: NOT DETECTED
Cannabinoid 50 Ng, Ur ~~LOC~~: NOT DETECTED
Cocaine Metabolite,Ur ~~LOC~~: NOT DETECTED
MDMA (Ecstasy)Ur Screen: NOT DETECTED
Methadone Scn, Ur: NOT DETECTED
Opiate, Ur Screen: POSITIVE — AB
Phencyclidine (PCP) Ur S: NOT DETECTED
Tricyclic, Ur Screen: NOT DETECTED

## 2020-07-19 LAB — CREATININE, SERUM
Creatinine, Ser: 0.73 mg/dL (ref 0.44–1.00)
GFR calc Af Amer: >60 mL/min (ref >60)
GFR calc non Af Amer: >60 mL/min (ref >60)

## 2020-07-19 SURGERY — CHOLECYSTECTOMY, ROBOT-ASSISTED, LAPAROSCOPIC
Anesthesia: General | Site: Abdomen

## 2020-07-19 MED ORDER — ROCURONIUM BROMIDE 100 MG/10ML IV SOLN
INTRAVENOUS | Status: DC | PRN
  Administered 2020-07-19: 10 mg via INTRAVENOUS
  Administered 2020-07-19: 50 mg via INTRAVENOUS
  Administered 2020-07-19: 20 mg via INTRAVENOUS

## 2020-07-19 MED ORDER — ENOXAPARIN SODIUM 40 MG/0.4ML ~~LOC~~ SOLN
40.0000 mg | SUBCUTANEOUS | Status: DC
  Administered 2020-07-20 – 2020-07-22 (×3): 40 mg via SUBCUTANEOUS
  Filled 2020-07-19 (×3): qty 0.4

## 2020-07-19 MED ORDER — MEPERIDINE HCL 50 MG/ML IJ SOLN: 6.2500 mg | INTRAMUSCULAR | Status: DC | PRN

## 2020-07-19 MED ORDER — TRAMADOL HCL 50 MG PO TABS
50.0000 mg | ORAL_TABLET | Freq: Four times a day (QID) | ORAL | Status: DC | PRN
  Filled 2020-07-19: qty 1

## 2020-07-19 MED ORDER — PROPOFOL 10 MG/ML IV BOLUS
INTRAVENOUS | Status: AC
  Filled 2020-07-19: qty 40

## 2020-07-19 MED ORDER — DEXAMETHASONE SODIUM PHOSPHATE 10 MG/ML IJ SOLN
INTRAMUSCULAR | Status: DC | PRN
  Administered 2020-07-19: 5 mg via INTRAVENOUS

## 2020-07-19 MED ORDER — ROCURONIUM BROMIDE 10 MG/ML (PF) SYRINGE
PREFILLED_SYRINGE | INTRAVENOUS | Status: AC
  Filled 2020-07-19: qty 10

## 2020-07-19 MED ORDER — FLUTICASONE PROPIONATE 50 MCG/ACT NA SUSP
1.0000 | Freq: Every day | NASAL | Status: DC | PRN
  Filled 2020-07-19: qty 16

## 2020-07-19 MED ORDER — ONDANSETRON HCL 4 MG/2ML IJ SOLN
INTRAMUSCULAR | Status: DC | PRN
  Administered 2020-07-19: 4 mg via INTRAVENOUS

## 2020-07-19 MED ORDER — LACTATED RINGERS IV SOLN: INTRAVENOUS | Status: DC

## 2020-07-19 MED ORDER — OXYCODONE HCL 5 MG/5ML PO SOLN: 5.0000 mg | Freq: Once | ORAL | Status: AC | PRN

## 2020-07-19 MED ORDER — AMLODIPINE BESYLATE 5 MG PO TABS
5.0000 mg | ORAL_TABLET | Freq: Every day | ORAL | Status: DC
  Administered 2020-07-21 – 2020-07-22 (×2): 5 mg via ORAL
  Filled 2020-07-19 (×2): qty 1

## 2020-07-19 MED ORDER — IBUPROFEN 800 MG PO TABS: 800.0000 mg | ORAL_TABLET | Freq: Three times a day (TID) | ORAL | 0 refills | Status: DC | PRN

## 2020-07-19 MED ORDER — ORAL CARE MOUTH RINSE: 15.0000 mL | Freq: Once | OROMUCOSAL | Status: AC

## 2020-07-19 MED ORDER — DOCUSATE SODIUM 100 MG PO CAPS: 100.0000 mg | ORAL_CAPSULE | Freq: Two times a day (BID) | ORAL | 0 refills | Status: AC | PRN

## 2020-07-19 MED ORDER — GABAPENTIN 300 MG PO CAPS
ORAL_CAPSULE | ORAL | Status: AC
  Administered 2020-07-19: 300 mg via ORAL
  Filled 2020-07-19: qty 1

## 2020-07-19 MED ORDER — FENTANYL CITRATE (PF) 100 MCG/2ML IJ SOLN
INTRAMUSCULAR | Status: DC | PRN
  Administered 2020-07-19 (×2): 50 ug via INTRAVENOUS

## 2020-07-19 MED ORDER — OXYCODONE HCL 5 MG PO TABS: 5.0000 mg | ORAL_TABLET | Freq: Once | ORAL | Status: AC | PRN

## 2020-07-19 MED ORDER — HYDROCHLOROTHIAZIDE 25 MG PO TABS
25.0000 mg | ORAL_TABLET | Freq: Every day | ORAL | Status: DC
  Administered 2020-07-21 – 2020-07-22 (×2): 25 mg via ORAL
  Filled 2020-07-19 (×3): qty 1

## 2020-07-19 MED ORDER — TIZANIDINE HCL 2 MG PO TABS
2.0000 mg | ORAL_TABLET | Freq: Three times a day (TID) | ORAL | Status: DC
  Administered 2020-07-20 – 2020-07-22 (×5): 2 mg via ORAL
  Filled 2020-07-19 (×10): qty 1

## 2020-07-19 MED ORDER — INFLUENZA VAC A&B SA ADJ QUAD 0.5 ML IM PRSY
0.5000 mL | PREFILLED_SYRINGE | INTRAMUSCULAR | Status: AC
  Administered 2020-07-20: 0.5 mL via INTRAMUSCULAR
  Filled 2020-07-19: qty 0.5

## 2020-07-19 MED ORDER — PROPOFOL 10 MG/ML IV BOLUS
INTRAVENOUS | Status: DC | PRN
  Administered 2020-07-19: 150 mg via INTRAVENOUS

## 2020-07-19 MED ORDER — FENTANYL CITRATE (PF) 100 MCG/2ML IJ SOLN
INTRAMUSCULAR | Status: AC
  Filled 2020-07-19: qty 2

## 2020-07-19 MED ORDER — PROMETHAZINE HCL 25 MG/ML IJ SOLN: 6.2500 mg | INTRAMUSCULAR | Status: AC | PRN

## 2020-07-19 MED ORDER — IBUPROFEN 400 MG PO TABS: 600.0000 mg | ORAL_TABLET | Freq: Four times a day (QID) | ORAL | Status: DC | PRN

## 2020-07-19 MED ORDER — METOPROLOL SUCCINATE ER 50 MG PO TB24
50.0000 mg | ORAL_TABLET | Freq: Every day | ORAL | Status: DC
  Administered 2020-07-21 – 2020-07-22 (×2): 50 mg via ORAL
  Filled 2020-07-19 (×3): qty 1

## 2020-07-19 MED ORDER — ACETAMINOPHEN 325 MG PO TABS: 650.0000 mg | ORAL_TABLET | Freq: Three times a day (TID) | ORAL | 0 refills | Status: AC | PRN

## 2020-07-19 MED ORDER — EPHEDRINE 5 MG/ML INJ
INTRAVENOUS | Status: AC
  Filled 2020-07-19: qty 10

## 2020-07-19 MED ORDER — PROMETHAZINE HCL 25 MG/ML IJ SOLN
INTRAMUSCULAR | Status: AC
  Administered 2020-07-19: 12.5 mg via INTRAVENOUS
  Filled 2020-07-19: qty 1

## 2020-07-19 MED ORDER — GABAPENTIN 300 MG PO CAPS: 300.0000 mg | ORAL_CAPSULE | ORAL | Status: AC

## 2020-07-19 MED ORDER — SODIUM CHLORIDE (PF) 0.9 % IJ SOLN
INTRAMUSCULAR | Status: AC
  Filled 2020-07-19: qty 10

## 2020-07-19 MED ORDER — CHLORHEXIDINE GLUCONATE 0.12 % MT SOLN
15.0000 mL | Freq: Once | OROMUCOSAL | Status: AC
  Administered 2020-07-19: 15 mL via OROMUCOSAL

## 2020-07-19 MED ORDER — FENTANYL CITRATE (PF) 100 MCG/2ML IJ SOLN
INTRAMUSCULAR | Status: AC
  Administered 2020-07-19: 25 ug via INTRAVENOUS
  Filled 2020-07-19: qty 2

## 2020-07-19 MED ORDER — SIMVASTATIN 20 MG PO TABS
20.0000 mg | ORAL_TABLET | Freq: Every day | ORAL | Status: DC
  Administered 2020-07-21: 20 mg via ORAL
  Filled 2020-07-19 (×4): qty 1

## 2020-07-19 MED ORDER — LIDOCAINE-EPINEPHRINE 1 %-1:100000 IJ SOLN
INTRAMUSCULAR | Status: AC
  Filled 2020-07-19: qty 1

## 2020-07-19 MED ORDER — ACETAMINOPHEN 500 MG PO TABS
ORAL_TABLET | ORAL | Status: AC
  Administered 2020-07-19: 1000 mg via ORAL
  Filled 2020-07-19: qty 2

## 2020-07-19 MED ORDER — BUPIVACAINE HCL 0.5 % IJ SOLN
INTRAMUSCULAR | Status: DC | PRN
  Administered 2020-07-19: 19 mL

## 2020-07-19 MED ORDER — DEXAMETHASONE SODIUM PHOSPHATE 10 MG/ML IJ SOLN
INTRAMUSCULAR | Status: AC
  Filled 2020-07-19: qty 1

## 2020-07-19 MED ORDER — FENTANYL CITRATE (PF) 100 MCG/2ML IJ SOLN
25.0000 ug | INTRAMUSCULAR | Status: DC | PRN
  Administered 2020-07-19 (×3): 25 ug via INTRAVENOUS

## 2020-07-19 MED ORDER — CEFAZOLIN SODIUM-DEXTROSE 2-4 GM/100ML-% IV SOLN
2.0000 g | INTRAVENOUS | Status: AC
  Administered 2020-07-19: 2 g via INTRAVENOUS

## 2020-07-19 MED ORDER — LIDOCAINE HCL (PF) 2 % IJ SOLN
INTRAMUSCULAR | Status: AC
  Filled 2020-07-19: qty 5

## 2020-07-19 MED ORDER — HYDROCODONE-ACETAMINOPHEN 5-325 MG PO TABS
1.0000 | ORAL_TABLET | Freq: Four times a day (QID) | ORAL | Status: DC | PRN
  Administered 2020-07-20: 1 via ORAL
  Filled 2020-07-19: qty 1

## 2020-07-19 MED ORDER — LIDOCAINE-EPINEPHRINE 1 %-1:100000 IJ SOLN
INTRAMUSCULAR | Status: DC | PRN
  Administered 2020-07-19: 19 mL

## 2020-07-19 MED ORDER — HYDROCODONE-ACETAMINOPHEN 5-325 MG PO TABS
ORAL_TABLET | ORAL | Status: AC
  Administered 2020-07-19: 1 via ORAL
  Filled 2020-07-19: qty 1

## 2020-07-19 MED ORDER — PROPOFOL 10 MG/ML IV BOLUS
INTRAVENOUS | Status: DC | PRN
  Administered 2020-07-19 (×2): 150 ug/kg/min via INTRAVENOUS

## 2020-07-19 MED ORDER — BUPIVACAINE HCL (PF) 0.5 % IJ SOLN
INTRAMUSCULAR | Status: AC
  Filled 2020-07-19: qty 30

## 2020-07-19 MED ORDER — FAMOTIDINE 20 MG PO TABS
ORAL_TABLET | ORAL | Status: AC
  Administered 2020-07-19: 20 mg
  Filled 2020-07-19: qty 1

## 2020-07-19 MED ORDER — LORATADINE 10 MG PO TABS
10.0000 mg | ORAL_TABLET | Freq: Every day | ORAL | Status: DC
  Administered 2020-07-21 – 2020-07-22 (×2): 10 mg via ORAL
  Filled 2020-07-19 (×3): qty 1

## 2020-07-19 MED ORDER — PHENYLEPHRINE HCL (PRESSORS) 10 MG/ML IV SOLN
INTRAVENOUS | Status: AC
  Filled 2020-07-19: qty 1

## 2020-07-19 MED ORDER — SODIUM CHLORIDE FLUSH 0.9 % IV SOLN
INTRAVENOUS | Status: AC
  Filled 2020-07-19: qty 10

## 2020-07-19 MED ORDER — MELOXICAM 7.5 MG PO TABS
15.0000 mg | ORAL_TABLET | Freq: Every day | ORAL | Status: DC
  Filled 2020-07-19: qty 2

## 2020-07-19 MED ORDER — CHLORHEXIDINE GLUCONATE 0.12 % MT SOLN
OROMUCOSAL | Status: AC
  Filled 2020-07-19: qty 15

## 2020-07-19 MED ORDER — OXYCODONE HCL 5 MG PO TABS
ORAL_TABLET | ORAL | Status: AC
  Administered 2020-07-19: 5 mg via ORAL
  Filled 2020-07-19: qty 1

## 2020-07-19 MED ORDER — MORPHINE SULFATE (PF) 2 MG/ML IV SOLN
1.0000 mg | INTRAVENOUS | Status: DC | PRN
  Administered 2020-07-20 – 2020-07-21 (×8): 1 mg via INTRAVENOUS
  Filled 2020-07-19 (×9): qty 1

## 2020-07-19 MED ORDER — EPHEDRINE SULFATE 50 MG/ML IJ SOLN
INTRAMUSCULAR | Status: DC | PRN
  Administered 2020-07-19: 7.5 mg via INTRAVENOUS

## 2020-07-19 MED ORDER — OXCARBAZEPINE 300 MG PO TABS
300.0000 mg | ORAL_TABLET | Freq: Two times a day (BID) | ORAL | Status: DC
  Administered 2020-07-21 – 2020-07-22 (×3): 300 mg via ORAL
  Filled 2020-07-19 (×7): qty 1

## 2020-07-19 MED ORDER — HYDROCODONE-ACETAMINOPHEN 5-325 MG PO TABS
1.0000 | ORAL_TABLET | Freq: Four times a day (QID) | ORAL | 0 refills | Status: DC | PRN
Start: 2020-07-19 — End: 2020-07-22

## 2020-07-19 MED ORDER — ACETAMINOPHEN 500 MG PO TABS: 1000.0000 mg | ORAL_TABLET | ORAL | Status: AC

## 2020-07-19 MED ORDER — POTASSIUM CHLORIDE ER 10 MEQ PO TBCR
10.0000 meq | EXTENDED_RELEASE_TABLET | Freq: Every day | ORAL | Status: DC
  Administered 2020-07-21 – 2020-07-22 (×2): 10 meq via ORAL
  Filled 2020-07-19 (×6): qty 1

## 2020-07-19 MED ORDER — SUGAMMADEX SODIUM 200 MG/2ML IV SOLN
INTRAVENOUS | Status: DC | PRN
  Administered 2020-07-19: 200 mg via INTRAVENOUS

## 2020-07-19 MED ORDER — CHLORHEXIDINE GLUCONATE CLOTH 2 % EX PADS
6.0000 | MEDICATED_PAD | Freq: Once | CUTANEOUS | Status: AC
  Administered 2020-07-19: 6 via TOPICAL

## 2020-07-19 MED ORDER — ONDANSETRON 4 MG PO TBDP
4.0000 mg | ORAL_TABLET | Freq: Three times a day (TID) | ORAL | Status: DC | PRN
  Administered 2020-07-19 – 2020-07-20 (×3): 4 mg via ORAL
  Filled 2020-07-19 (×4): qty 1

## 2020-07-19 MED ORDER — HYDROCODONE-ACETAMINOPHEN 5-325 MG PO TABS
1.0000 | ORAL_TABLET | Freq: Four times a day (QID) | ORAL | Status: DC | PRN
  Administered 2020-07-22: 1 via ORAL
  Filled 2020-07-19: qty 1
  Filled 2020-07-19: qty 2

## 2020-07-19 MED ORDER — IBUPROFEN 800 MG PO TABS
ORAL_TABLET | ORAL | Status: AC
  Filled 2020-07-19: qty 1

## 2020-07-19 MED ORDER — CEFAZOLIN SODIUM-DEXTROSE 2-4 GM/100ML-% IV SOLN
INTRAVENOUS | Status: AC
  Filled 2020-07-19: qty 100

## 2020-07-19 MED ORDER — IBUPROFEN 800 MG PO TABS
800.0000 mg | ORAL_TABLET | Freq: Three times a day (TID) | ORAL | Status: DC | PRN
  Filled 2020-07-19: qty 1

## 2020-07-19 MED ORDER — LIDOCAINE HCL (CARDIAC) PF 100 MG/5ML IV SOSY
PREFILLED_SYRINGE | INTRAVENOUS | Status: DC | PRN
  Administered 2020-07-19: 80 mg via INTRAVENOUS

## 2020-07-19 MED ORDER — ONDANSETRON HCL 4 MG/2ML IJ SOLN
INTRAMUSCULAR | Status: AC
  Filled 2020-07-19: qty 2

## 2020-07-19 SURGICAL SUPPLY — 52 items
ANCHOR TIS RET SYS 235ML (MISCELLANEOUS) ×3 IMPLANT
BAG INFUSER PRESSURE 100CC (MISCELLANEOUS) IMPLANT
BLADE SURG SZ11 CARB STEEL (BLADE) ×3 IMPLANT
CANISTER SUCT 1200ML W/VALVE (MISCELLANEOUS) ×3 IMPLANT
CANNULA REDUC XI 12-8 STAPL (CANNULA) ×1
CANNULA REDUC XI 12-8MM STAPL (CANNULA) ×1
CANNULA REDUCER 12-8 DVNC XI (CANNULA) ×1 IMPLANT
CHLORAPREP W/TINT 26 (MISCELLANEOUS) ×3 IMPLANT
CLIP VESOLOCK MED LG 6/CT (CLIP) ×3 IMPLANT
COVER TIP SHEARS 8 DVNC (MISCELLANEOUS) ×1 IMPLANT
COVER TIP SHEARS 8MM DA VINCI (MISCELLANEOUS) ×2
COVER WAND RF STERILE (DRAPES) ×3 IMPLANT
DECANTER SPIKE VIAL GLASS SM (MISCELLANEOUS) ×6 IMPLANT
DEFOGGER SCOPE WARMER CLEARIFY (MISCELLANEOUS) ×3 IMPLANT
DERMABOND ADVANCED (GAUZE/BANDAGES/DRESSINGS) ×2
DERMABOND ADVANCED .7 DNX12 (GAUZE/BANDAGES/DRESSINGS) ×1 IMPLANT
DRAPE ARM DVNC X/XI (DISPOSABLE) ×4 IMPLANT
DRAPE COLUMN DVNC XI (DISPOSABLE) ×1 IMPLANT
DRAPE DA VINCI XI ARM (DISPOSABLE) ×8
DRAPE DA VINCI XI COLUMN (DISPOSABLE) ×2
ELECT CAUTERY BLADE 6.4 (BLADE) ×3 IMPLANT
ELECT REM PT RETURN 9FT ADLT (ELECTROSURGICAL) ×3
ELECTRODE REM PT RTRN 9FT ADLT (ELECTROSURGICAL) ×1 IMPLANT
GLOVE BIOGEL PI IND STRL 7.0 (GLOVE) ×2 IMPLANT
GLOVE BIOGEL PI INDICATOR 7.0 (GLOVE) ×4
GLOVE SURG SYN 6.5 ES PF (GLOVE) ×6 IMPLANT
GOWN STRL REUS W/ TWL LRG LVL3 (GOWN DISPOSABLE) ×3 IMPLANT
GOWN STRL REUS W/TWL LRG LVL3 (GOWN DISPOSABLE) ×6
GRASPER SUT TROCAR 14GX15 (MISCELLANEOUS) IMPLANT
IRRIGATOR SUCT 8 DISP DVNC XI (IRRIGATION / IRRIGATOR) IMPLANT
IRRIGATOR SUCTION 8MM XI DISP (IRRIGATION / IRRIGATOR)
IV NS 1000ML (IV SOLUTION)
IV NS 1000ML BAXH (IV SOLUTION) IMPLANT
LABEL OR SOLS (LABEL) ×3 IMPLANT
NEEDLE HYPO 22GX1.5 SAFETY (NEEDLE) ×3 IMPLANT
NEEDLE INSUFFLATION 14GA 120MM (NEEDLE) ×3 IMPLANT
NS IRRIG 500ML POUR BTL (IV SOLUTION) ×3 IMPLANT
OBTURATOR OPTICAL STANDARD 8MM (TROCAR) ×2
OBTURATOR OPTICAL STND 8 DVNC (TROCAR) ×1
OBTURATOR OPTICALSTD 8 DVNC (TROCAR) ×1 IMPLANT
PACK LAP CHOLECYSTECTOMY (MISCELLANEOUS) ×3 IMPLANT
PENCIL ELECTRO HAND CTR (MISCELLANEOUS) ×3 IMPLANT
SEAL CANN UNIV 5-8 DVNC XI (MISCELLANEOUS) ×3 IMPLANT
SEAL XI 5MM-8MM UNIVERSAL (MISCELLANEOUS) ×6
SET TUBE SMOKE EVAC HIGH FLOW (TUBING) ×3 IMPLANT
SOLUTION ELECTROLUBE (MISCELLANEOUS) ×3 IMPLANT
STAPLER CANNULA SEAL DVNC XI (STAPLE) ×1 IMPLANT
STAPLER CANNULA SEAL XI (STAPLE) ×2
SUT MNCRL 4-0 (SUTURE) ×4
SUT MNCRL 4-0 27XMFL (SUTURE) ×2
SUT VICRYL 0 AB UR-6 (SUTURE) ×3 IMPLANT
SUTURE MNCRL 4-0 27XMF (SUTURE) ×2 IMPLANT

## 2020-07-19 NOTE — OR Nursing (Addendum)
Spoke with OR nurse about situation with Kristin Stuart, so she could relay message to Dr. Tonna Boehringer, " no one to stay with her tonight".  Also asked for Norco and Ibuprofen order. Verbal order given for these. Dr. Tonna Boehringer he wpold address the situation once he was done with his other case.

## 2020-07-19 NOTE — Op Note (Addendum)
Preoperative diagnosis:  Chronic cholecystitis  Postoperative diagnosis: same as above plus left flank lipoma  Procedure: Robotic assisted Laparoscopic Cholecystectomy. Left flank lipoma removal  Anesthesia: GETA   Surgeon: Sung Amabile  Specimen: Gallbladder  Complications: None  EBL: 40mL  Wound Classification: Clean Contaminated  Indications: see HPI  Findings: Critical view of safety noted Cystic duct and artery identified, ligated and divided, clips remained intact at end of procedure Adequate hemostasis  Description of procedure:  The patient was placed on the operating table in the supine position. SCDs placed, pre-op abx administered.  General anesthesia was induced and OG tube placed by anesthesia. A time-out was completed verifying correct patient, procedure, site, positioning, and implant(s) and/or special equipment prior to beginning this procedure. The abdomen was prepped and draped in the usual sterile fashion.    Veress needle was placed at the Palmer's point and insufflation was started after confirming a positive saline drop test and no immediate increase in abdominal pressure.  After reaching 15 mm, the Veress needle was removed and a 8 mm port was placed via optiview technique under umbilicus measured 19mm from gallbladder.  The abdomen was inspected and no abnormalities or injuries were found.  Under direct vision, ports were placed in the following locations: One 12 mm patient left of the umbilicus, 8cm from the optiviewed port, one 8 mm port placed to the patient right of the umbilical port 8 cm apart.  1 additional 8 mm port placed lateral to the 24mm port.  Once ports were placed, The table was placed in the reverse Trendelenburg position with the right side up. The Xi platform was brought into the operative field and docked to the ports successfully.  An endoscope was placed through the umbilical port, fenestrated grasper through the adjacent patient right port,  prograsp to the far patient left port, and then a hook cautery in the left port.  The dome of the gallbladder was grasped with prograsp, passed and retracted over the dome of the liver. Adhesions between the gallbladder and omentum, duodenum and transverse colon were lysed via hook cautery. The infundibulum was grasped with the fenestrated grasper and retracted toward the right lower quadrant. This maneuver exposed Calot's triangle. The peritoneum overlying the gallbladder infundibulum was then dissected using combination of [place] dissector and electrocautery hook and the cystic duct and cystic artery identified.  Critical view of safety with the liver bed clearly visible behind the duct and artery with no additional structures noted.  The cystic duct and cystic artery clipped and divided close to the gallbladder.     The gallbladder was then dissected from its peritoneal and liver bed attachments by electrocautery. Hemostasis was checked prior to removing the hook cautery and the Endo Catch bag was then placed through the 12 mm port and the gallbladder was removed.  The gallbladder was passed off the table as a specimen. There was no evidence of bleeding from the gallbladder fossa or cystic artery or leakage of the bile from the cystic duct stump. The 12 mm port site closed with PMI using 0 vicryl under direct vision.  Abdomen desufflated and secondary trocars were removed under direct vision. No bleeding was noted.   Inspection of left latera port noted a lipoma already starting to extrude out from incision.  Decision made to excise lipoma to facilitate better closure of port site wound. The incision extended and the lipoma itself was dissected free from surrounding structure within the subcutaneous fat, removed completely.  Total  measuring approx 6cm x 5cm x 5.5cm.  Wound hemostasis noted, area irrigated.  Left side port sites approximated with 3-0 vicryl.  All skin incisions then closed with  subcuticular sutures of 4-0 monocryl and dressed with topical skin adhesive. The orogastric tube was removed and patient extubated.  The patient tolerated the procedure well and was taken to the postanesthesia care unit in stable condition.  All sponge and instrument count correct at end of procedure.

## 2020-07-19 NOTE — Anesthesia Preprocedure Evaluation (Signed)
Anesthesia Evaluation  Patient identified by MRN, date of birth, ID band Patient awake    Reviewed: Allergy & Precautions, NPO status , Patient's Chart, lab work & pertinent test results  History of Anesthesia Complications (+) PONV and history of anesthetic complications  Airway Mallampati: II  TM Distance: >3 FB Neck ROM: Full    Dental no notable dental hx.    Pulmonary neg sleep apnea, neg COPD, former smoker,    breath sounds clear to auscultation- rhonchi (-) wheezing      Cardiovascular Exercise Tolerance: Good hypertension, Pt. on medications (-) CAD, (-) Past MI, (-) Cardiac Stents and (-) CABG  Rhythm:Regular Rate:Normal - Systolic murmurs and - Diastolic murmurs    Neuro/Psych  Headaches, TIAnegative psych ROS   GI/Hepatic Neg liver ROS, hiatal hernia, GERD  ,  Endo/Other  neg diabetesHypothyroidism   Renal/GU negative Renal ROS     Musculoskeletal  (+) Arthritis , Fibromyalgia -  Abdominal (+) - obese,   Peds  Hematology  (+) anemia ,   Anesthesia Other Findings Past Medical History: No date: Anemia No date: Arthritis No date: Carpal tunnel syndrome, bilateral No date: Edema     Comment:  LEGS/FEET No date: Edema     Comment:  legs/feet No date: Fibromyalgia No date: GERD (gastroesophageal reflux disease) No date: Headache No date: History of hiatal hernia No date: Hypercholesteremia No date: Hypertension No date: Hypothyroid No date: Plantar fasciitis No date: PONV (postoperative nausea and vomiting) 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 No date: Raynaud's syndrome No date: Stroke [facility])     Comment:  tia 2012: TIA (transient ischemic attack)   Reproductive/Obstetrics                             Anesthesia Physical Anesthesia Plan  ASA: III  Anesthesia Plan: General   Post-op Pain Management:    Induction: Intravenous  PONV Risk Score and Plan: 3 and  Ondansetron, Dexamethasone and TIVA  Airway Management Planned: Oral ETT  Additional Equipment:   Intra-op Plan:   Post-operative Plan: Extubation in OR  Informed Consent: I have reviewed the patients History and Physical, chart, labs and discussed the procedure including the risks, benefits and alternatives for the proposed anesthesia with the patient or authorized representative who has indicated his/her understanding and acceptance.     Dental advisory given  Plan Discussed with: CRNA and Anesthesiologist  Anesthesia Plan Comments:         Anesthesia Quick Evaluation

## 2020-07-19 NOTE — Anesthesia Procedure Notes (Signed)
Procedure Name: Intubation Date/Time: 07/19/2020 8:55 AM Performed by: Clyde Lundborg, CRNA Pre-anesthesia Checklist: Patient identified, Emergency Drugs available, Suction available and Patient being monitored Patient Re-evaluated:Patient Re-evaluated prior to induction Oxygen Delivery Method: Circle system utilized Preoxygenation: Pre-oxygenation with 100% oxygen Induction Type: IV induction Ventilation: Mask ventilation without difficulty Laryngoscope Size: McGraph and 3 Grade View: Grade II Tube type: Oral Tube size: 7.0 mm Number of attempts: 1 Airway Equipment and Method: Stylet and Video-laryngoscopy Placement Confirmation: ETT inserted through vocal cords under direct vision,  positive ETCO2,  breath sounds checked- equal and bilateral and CO2 detector Secured at: 20 cm Tube secured with: Tape Dental Injury: Teeth and Oropharynx as per pre-operative assessment

## 2020-07-19 NOTE — OR Nursing (Signed)
Dr. Tonna Boehringer here to check on patient.  Advised him that she has no one to stay with her tonight.  Also that she has been medicated for nausea / vomiting x 2

## 2020-07-19 NOTE — Interval H&P Note (Signed)
History and Physical Interval Note:  07/19/2020 7:58 AM  Kristin Stuart  has presented today for surgery, with the diagnosis of K81.1 Chronic cholecystitis.  The various methods of treatment have been discussed with the patient and family. After consideration of risks, benefits and other options for treatment, the patient has consented to  Procedure(s): XI ROBOTIC ASSISTED LAPAROSCOPIC CHOLECYSTECTOMY (N/A) as a surgical intervention.  The patient's history has been reviewed, patient examined, no change in status, stable for surgery.  I have reviewed the patient's chart and labs.  Questions were answered to the patient's satisfaction.     Halton Neas Tonna Boehringer

## 2020-07-19 NOTE — Anesthesia Postprocedure Evaluation (Signed)
Anesthesia Post Note  Patient: Kristin Stuart  Procedure(s) Performed: XI ROBOTIC ASSISTED LAPAROSCOPIC CHOLECYSTECTOMY (N/A Abdomen)  Patient location during evaluation: PACU Anesthesia Type: General Level of consciousness: awake and alert and oriented Pain management: pain level controlled Vital Signs Assessment: post-procedure vital signs reviewed and stable Respiratory status: spontaneous breathing, nonlabored ventilation and respiratory function stable Cardiovascular status: blood pressure returned to baseline and stable Postop Assessment: no signs of nausea or vomiting Anesthetic complications: no   No complications documented.   Last Vitals:  Vitals:   07/19/20 1110 07/19/20 1126  BP: 140/70 136/76  Pulse: 63 62  Resp: 15 14  Temp:    SpO2: 100% 100%    Last Pain:  Vitals:   07/19/20 1126  TempSrc:   PainSc: Asleep                 Tifanie Gardiner

## 2020-07-19 NOTE — OR Nursing (Signed)
Got patient up in recliner.  Patient hurts when moves, but then easily doses back to sleep.  Called [facility] because patient stated that" she has no one to stay with her tonight".  Dr. Tonna Boehringer is in OR, but message left for him to come talk with [place] in PACU about the situation.  Also we are calling Mrs. Kriegel contact information to see if anyone can stay with her tonight.

## 2020-07-19 NOTE — Transfer of Care (Signed)
Immediate Anesthesia Transfer of Care Note  Patient: Kristin Stuart  Procedure(s) Performed: XI ROBOTIC ASSISTED LAPAROSCOPIC CHOLECYSTECTOMY (N/A Abdomen)  Patient Location: PACU  Anesthesia Type:General  Level of Consciousness: awake, alert  and oriented  Airway & Oxygen Therapy: Patient Spontanous Breathing  Post-op Assessment: Report given to RN and Post -op Vital signs reviewed and stable  Post vital signs: Reviewed and stable  Last Vitals:  Vitals Value Taken Time  BP    Temp    Pulse    Resp    SpO2      Last Pain:  Vitals:   07/19/20 0631  TempSrc: Tympanic  PainSc: 0-No pain         Complications: No complications documented.

## 2020-07-20 DIAGNOSIS — K811 Chronic cholecystitis: Secondary | ICD-10-CM | POA: Diagnosis not present

## 2020-07-20 MED ORDER — PROMETHAZINE HCL 25 MG/ML IJ SOLN
12.5000 mg | Freq: Once | INTRAMUSCULAR | Status: AC
  Administered 2020-07-20: 12.5 mg via INTRAVENOUS
  Filled 2020-07-20: qty 1

## 2020-07-20 MED ORDER — ONDANSETRON HCL 4 MG/2ML IJ SOLN
4.0000 mg | Freq: Three times a day (TID) | INTRAMUSCULAR | Status: DC | PRN
  Administered 2020-07-20 – 2020-07-22 (×4): 4 mg via INTRAVENOUS
  Filled 2020-07-20 (×4): qty 2

## 2020-07-20 MED ORDER — ONDANSETRON 4 MG PO TBDP: 4.0000 mg | ORAL_TABLET | Freq: Three times a day (TID) | ORAL | Status: DC | PRN

## 2020-07-20 NOTE — Progress Notes (Signed)
SURGICAL PROGRESS NOTE   Hospital Day(s): 0.   Post op day(s): 1 Day Post-Op.   Interval History: Patient seen and examined, no acute events or new complaints overnight. Patient reports feeling nauseous and with severe pain.  Patient endorses that she is uncomfortable and unable to ambulate.  She reports that she is afraid to eat because of the nausea.  Vital signs in last 24 hours: [min-max] current  Temp:  [97.1 F (36.2 C)-99.3 F (37.4 C)] 99.3 F (37.4 C) (09/18 0453) Pulse Rate:  [62-86] 73 (09/18 0453) Resp:  [10-21] 18 (09/18 0453) BP: (124-169)/(60-79) 167/74 (09/18 0453) SpO2:  [90 %-100 %] 99 % (09/18 0453)     Height: 5\' 6"  (167.6 cm) Weight: 67.6 kg BMI (Calculated): 24.06   Physical Exam:  Constitutional: alert, cooperative and no distress  Respiratory: breathing non-labored at rest  Cardiovascular: regular rate and sinus rhythm  Gastrointestinal: soft, non-tender, and non-distended.  Wounds are dry and clean  Labs:  CBC Latest Ref Rng & Units 07/19/2020 07/19/2020 07/17/2020  WBC 4.0 - 10.5 K/uL 4.3 - 3.2(L)  Hemoglobin 12.0 - 15.0 g/dL 11.1(L) 12.2 11.2(L)  Hematocrit 36 - 46 % 32.8(L) 36.0 32.8(L)  Platelets 150 - 400 K/uL 230 - 303   CMP Latest Ref Rng & Units 07/19/2020 07/19/2020 07/17/2020  Glucose 70 - 99 mg/dL - 07/19/2020) 94  BUN 8 - 23 mg/dL - 15 14  Creatinine 580(D - 1.00 mg/dL 9.83 3.82 5.05  Sodium 135 - 145 mmol/L - 136 132(L)  Potassium 3.5 - 5.1 mmol/L - 3.9 4.0  Chloride 98 - 111 mmol/L - 97(L) 94(L)  CO2 22 - 32 mmol/L - - 26  Calcium 8.9 - 10.3 mg/dL - - 9.2  Total Protein 6.5 - 8.1 g/dL - - -  Total Bilirubin 0.3 - 1.2 mg/dL - - -  Alkaline Phos 38 - 126 U/L - - -  AST 15 - 41 U/L - - -  ALT 0 - 44 U/L - - -    Imaging studies: No new pertinent imaging studies   Assessment/Plan:  69 y.o. female with chronic cholecystitis 1 Day Post-Op s/p robotic assisted laparoscopic cholecystectomy, complicated by pertinent comorbidities including  chronic pain, fibromyalgia, GERD hypercholesterolemia, hypertension.  Patient nauseous and with pain unable to be controlled with crampy pain medication.  Patient not comfortable to be able to go home since she has been eating and is nauseous.  Continue with pain medication and nausea medications patient can tolerate diet and be able to be discharged home.  I encouraged the patient to ambulate.  62, MD

## 2020-07-21 DIAGNOSIS — K811 Chronic cholecystitis: Secondary | ICD-10-CM | POA: Diagnosis not present

## 2020-07-21 MED ORDER — SCOPOLAMINE 1 MG/3DAYS TD PT72
1.0000 | MEDICATED_PATCH | TRANSDERMAL | Status: DC
  Administered 2020-07-21: 1.5 mg via TRANSDERMAL
  Filled 2020-07-21: qty 1

## 2020-07-21 MED ORDER — LEVOTHYROXINE SODIUM 112 MCG PO TABS
112.0000 ug | ORAL_TABLET | Freq: Every day | ORAL | Status: DC
  Administered 2020-07-21 – 2020-07-22 (×2): 112 ug via ORAL
  Filled 2020-07-21 (×3): qty 1

## 2020-07-21 NOTE — Progress Notes (Signed)
SURGICAL PROGRESS NOTE   Hospital Day(s): 0.   Post op day(s): 2 Days Post-Op.   Interval History: Patient seen and examined, no acute events or new complaints overnight. Patient reports continued having persistent nausea.  She is even afraid of taking oral pain medications.  She has not eating well.  She also continued having pain 8 out of 10 with no improvement with current pain medication.  Vital signs in last 24 hours: [min-max] current  Temp:  [98.3 F (36.8 C)-99.1 F (37.3 C)] 98.4 F (36.9 C) (09/19 0538) Pulse Rate:  [64-77] 77 (09/19 0538) Resp:  [16-18] 16 (09/19 0538) BP: (155-170)/(75-91) 170/91 (09/19 0538) SpO2:  [97 %-100 %] 97 % (09/19 0538)     Height: 5\' 6"  (167.6 cm) Weight: 67.6 kg BMI (Calculated): 24.06   Physical Exam:  Constitutional: alert, cooperative and no distress  Respiratory: breathing non-labored at rest  Cardiovascular: regular rate and sinus rhythm  Gastrointestinal: soft, non-tender, and non-distended  Labs:  CBC Latest Ref Rng & Units 07/19/2020 07/19/2020 07/17/2020  WBC 4.0 - 10.5 K/uL 4.3 - 3.2(L)  Hemoglobin 12.0 - 15.0 g/dL 11.1(L) 12.2 11.2(L)  Hematocrit 36 - 46 % 32.8(L) 36.0 32.8(L)  Platelets 150 - 400 K/uL 230 - 303   CMP Latest Ref Rng & Units 07/19/2020 07/19/2020 07/17/2020  Glucose 70 - 99 mg/dL - 07/19/2020) 94  BUN 8 - 23 mg/dL - 15 14  Creatinine 607(P - 1.00 mg/dL 7.10 6.26 9.48  Sodium 135 - 145 mmol/L - 136 132(L)  Potassium 3.5 - 5.1 mmol/L - 3.9 4.0  Chloride 98 - 111 mmol/L - 97(L) 94(L)  CO2 22 - 32 mmol/L - - 26  Calcium 8.9 - 10.3 mg/dL - - 9.2  Total Protein 6.5 - 8.1 g/dL - - -  Total Bilirubin 0.3 - 1.2 mg/dL - - -  Alkaline Phos 38 - 126 U/L - - -  AST 15 - 41 U/L - - -  ALT 0 - 44 U/L - - -    Imaging studies: No new pertinent imaging studies   Assessment/Plan:  69 y.o. female with chronic cholecystitis 2 Day Post-Op s/p robotic assisted laparoscopic cholecystectomy, complicated by pertinent comorbidities  including chronic pain, fibromyalgia, GERD hypercholesterolemia, hypertension.  Persistent nausea after anesthesia.  I will add scopolamine patch to see if this helps control the nausea.  Hopefully control her nausea she will feel better and the pain will improve as well.  Also patient will be able to eat and get his oral medications.  I encouraged the patient to ambulate.  62, MD

## 2020-07-22 DIAGNOSIS — K811 Chronic cholecystitis: Secondary | ICD-10-CM | POA: Diagnosis not present

## 2020-07-22 LAB — CBC WITH DIFFERENTIAL/PLATELET
Abs Immature Granulocytes: 0.02 10*3/uL (ref 0.00–0.07)
Basophils Absolute: 0 10*3/uL (ref 0.0–0.1)
Basophils Relative: 0 %
Eosinophils Absolute: 0.8 10*3/uL — ABNORMAL HIGH (ref 0.0–0.5)
Eosinophils Relative: 13 %
HCT: 40.6 % (ref 36.0–46.0)
Hemoglobin: 14.1 g/dL (ref 12.0–15.0)
Immature Granulocytes: 0 %
Lymphocytes Relative: 26 %
Lymphs Abs: 1.6 10*3/uL (ref 0.7–4.0)
MCH: 29.6 pg (ref 26.0–34.0)
MCHC: 34.7 g/dL (ref 30.0–36.0)
MCV: 85.1 fL (ref 80.0–100.0)
Monocytes Absolute: 0.5 10*3/uL (ref 0.1–1.0)
Monocytes Relative: 8 %
Neutro Abs: 3.1 10*3/uL (ref 1.7–7.7)
Neutrophils Relative %: 53 %
Platelets: 299 10*3/uL (ref 150–400)
RBC: 4.77 MIL/uL (ref 3.87–5.11)
RDW: 11.3 % — ABNORMAL LOW (ref 11.5–15.5)
WBC: 5.9 10*3/uL (ref 4.0–10.5)
nRBC: 0 % (ref 0.0–0.2)

## 2020-07-22 LAB — HEPATIC FUNCTION PANEL
ALT: 17 U/L (ref 0–44)
AST: 22 U/L (ref 15–41)
Albumin: 4.7 g/dL (ref 3.5–5.0)
Alkaline Phosphatase: 78 U/L (ref 38–126)
Bilirubin, Direct: 0.1 mg/dL (ref 0.0–0.2)
Indirect Bilirubin: 0.7 mg/dL (ref 0.3–0.9)
Total Bilirubin: 0.8 mg/dL (ref 0.3–1.2)
Total Protein: 8.6 g/dL — ABNORMAL HIGH (ref 6.5–8.1)

## 2020-07-22 LAB — BASIC METABOLIC PANEL
Anion gap: 12 (ref 5–15)
BUN: 15 mg/dL (ref 8–23)
CO2: 28 mmol/L (ref 22–32)
Calcium: 9.8 mg/dL (ref 8.9–10.3)
Chloride: 94 mmol/L — ABNORMAL LOW (ref 98–111)
Creatinine, Ser: 0.75 mg/dL (ref 0.44–1.00)
GFR calc Af Amer: >60 mL/min (ref >60)
GFR calc non Af Amer: >60 mL/min (ref >60)
Glucose, Bld: 104 mg/dL — ABNORMAL HIGH (ref 70–99)
Potassium: 4.6 mmol/L (ref 3.5–5.1)
Sodium: 134 mmol/L — ABNORMAL LOW (ref 135–145)

## 2020-07-22 LAB — SURGICAL PATHOLOGY

## 2020-07-22 MED ORDER — TRAMADOL HCL 50 MG PO TABS: 50.0000 mg | ORAL_TABLET | Freq: Four times a day (QID) | ORAL | Status: DC | PRN

## 2020-07-22 MED ORDER — GABAPENTIN 100 MG PO CAPS: 100.0000 mg | ORAL_CAPSULE | Freq: Every day | ORAL | 0 refills | Status: AC | PRN

## 2020-07-22 MED ORDER — GABAPENTIN 100 MG PO CAPS
200.0000 mg | ORAL_CAPSULE | Freq: Two times a day (BID) | ORAL | Status: DC
  Administered 2020-07-22: 200 mg via ORAL
  Filled 2020-07-22: qty 2

## 2020-07-22 MED ORDER — ONDANSETRON HCL 4 MG PO TABS: 4.0000 mg | ORAL_TABLET | Freq: Three times a day (TID) | ORAL | 0 refills | Status: AC | PRN

## 2020-07-22 MED ORDER — HYDROCODONE-ACETAMINOPHEN 5-325 MG PO TABS
1.0000 | ORAL_TABLET | Freq: Four times a day (QID) | ORAL | 0 refills | Status: AC | PRN
Start: 2020-07-22 — End: ?

## 2020-07-22 MED ORDER — ACETAMINOPHEN 325 MG PO TABS: 650.0000 mg | ORAL_TABLET | Freq: Four times a day (QID) | ORAL | Status: DC | PRN

## 2020-07-22 MED ORDER — HYDROCODONE-ACETAMINOPHEN 5-325 MG PO TABS
1.0000 | ORAL_TABLET | Freq: Four times a day (QID) | ORAL | Status: DC | PRN
  Administered 2020-07-22: 1 via ORAL
  Filled 2020-07-22: qty 1

## 2020-07-22 MED ORDER — CELECOXIB 200 MG PO CAPS
200.0000 mg | ORAL_CAPSULE | Freq: Every day | ORAL | Status: DC
  Administered 2020-07-22: 200 mg via ORAL
  Filled 2020-07-22: qty 1

## 2020-07-22 NOTE — Progress Notes (Signed)
Patient being discharged to home per provider. All medications and education has been reviewed with patient. All IV's have been removed and sites are clean, dry, and intact. Patient will be transported by wheelchair to front mall entrance by employee. Patient verbalized understanding. No further questions or concerns at this time.     Luvenia Starch, RN

## 2020-07-22 NOTE — Discharge Instructions (Signed)
Laparoscopic Cholecystectomy, Care After This sheet gives you information about how to care for yourself after your procedure. Your doctor may also give you more specific instructions. If you have problems or questions, contact your doctor. Follow these instructions at home: Care for cuts from surgery (incisions)   Follow instructions from your doctor about how to take care of your cuts from surgery. Make sure you: ? Wash your hands with soap and water before you change your bandage (dressing). If you cannot use soap and water, use hand sanitizer. ? Change your bandage as told by your doctor. ? Leave stitches (sutures), skin glue, or skin tape (adhesive) strips in place. They may need to stay in place for 2 weeks or longer. If tape strips get loose and curl up, you may trim the loose edges. Do not remove tape strips completely unless your doctor says it is okay.  Do not take baths, swim, or use a hot tub until your doctor says it is okay. OK TO SHOWER 24HRS AFTER YOUR SURGERY.   Check your surgical cut area every day for signs of infection. Check for: ? More redness, swelling, or pain. ? More fluid or blood. ? Warmth. ? Pus or a bad smell. Activity  Do not drive or use heavy machinery while taking prescription pain medicine.  Do not play contact sports until your doctor says it is okay.  Do not drive for 24 hours if you were given a medicine to help you relax (sedative).  Rest as needed. Do not return to work or school until your doctor says it is okay. General instructions .  tylenol and neurontin as needed for discomfort.  Please alternate between the two as needed for pain.   .  Use narcotics, if prescribed, only when above meds not enough to control pain. .  325-650mg  every 8hrs to max of 3000mg /24hrs (including the 325mg  in every norco dose) for the tylenol.    To prevent or treat constipation while you are taking prescription pain medicine, your doctor may recommend that  you: ? Drink enough fluid to keep your pee (urine) clear or pale yellow. ? Take over-the-counter or prescription medicines. ? Eat foods that are high in fiber, such as fresh fruits and vegetables, whole grains, and beans. ? Limit foods that are high in fat and processed sugars, such as fried and sweet foods. Contact a doctor if:  You develop a rash.  You have more redness, swelling, or pain around your surgical cuts.  You have more fluid or blood coming from your surgical cuts.  Your surgical cuts feel warm to the touch.  You have pus or a bad smell coming from your surgical cuts.  You have a fever.  One or more of your surgical cuts breaks open. Get help right away if:  You have trouble breathing.  You have chest pain.  You have pain that is getting worse in your shoulders.  You faint or feel dizzy when you stand.  You have very bad pain in your belly (abdomen).  You are sick to your stomach (nauseous) for more than one day.  You have throwing up (vomiting) that lasts for more than one day.  You have leg pain. This information is not intended to replace advice given to you by your health care provider. Make sure you discuss any questions you have with your health care provider. Document Released: 07/28/2008 Document Revised: 05/09/2016 Document Reviewed: 04/06/2016 Elsevier Interactive Patient Education  2019 07/10/2016.  AMBULATORY SURGERY  DISCHARGE INSTRUCTIONS   1) The drugs that you were given will stay in your system until tomorrow so for the next 24 hours you should not:  A) Drive an automobile B) Make any legal decisions C) Drink any alcoholic beverage   2) You may resume regular meals tomorrow.  Today it is better to start with liquids and gradually work up to solid foods.  You may eat anything you prefer, but it is better to start with liquids, then soup and crackers, and gradually work up to solid foods.   3) Please notify your doctor  immediately if you have any unusual bleeding, trouble breathing, redness and pain at the surgery site, drainage, fever, or pain not relieved by medication.    4) Additional Instructions:        Please contact your physician with any problems or Same Day Surgery at [phone], Monday through Friday 6 am to 4 pm, or [place] at [facility] number at [phone].

## 2020-07-22 NOTE — Discharge Summary (Signed)
Physician Discharge Summary  Patient ID: Kristin Stuart MRN: [id number] DOB/AGE: 02/19/1951 69 y.o.  Admit date: 07/19/2020 Discharge date: 07/22/20  Admission Diagnoses: chronic cholecystitis  Discharge Diagnoses:  Same as above  Discharged Condition: good  Hospital Course: admitted for above.  Post op nausea and pain control needed so admitted for it.  Resolved with some phenergan and extra day of narcotics.  At time of discharge, pain better controlled, nausea improving, Pt requesting to go home.  Repeat labs this am, all wnl.  Consults: None  Discharge Exam: Blood pressure (!) 150/77, pulse 70, temperature 97.8 F (36.6 C), temperature source Oral, resp. rate 16, height 5\' 6"  (1.676 m), weight 67.6 kg, SpO2 98 %. General appearance: alert, cooperative and no distress Cardio: regular rate and rhythm GI: soft, non-tender; bowel sounds normal; no masses,  no organomegaly, incisions c/d/i  Disposition:  Discharge disposition: 01-Home or Self Care       Discharge Instructions    Discharge patient   Complete by: As directed    Discharge disposition: 01-Home or Self Care   Discharge patient date: 07/19/2020     Allergies as of 07/22/2020      Reactions   Penicillins Other (See Comments)   Vaginal infection Has patient had a PCN reaction causing immediate rash, facial/tongue/throat swelling, SOB or lightheadedness with hypotension: No Has patient had a PCN reaction causing severe rash involving mucus membranes or skin necrosis: No Has patient had a PCN reaction that required hospitalization No Has patient had a PCN reaction occurring within the last 10 years: Unknown If all of the above answers are "NO", then may proceed with Cephalosporin use   Norvasc [amlodipine] Swelling, Other (See Comments)   Can tolerate low dose. Ankle swelling   Vancomycin Rash      Medication List    STOP taking these medications   acetaminophen-codeine 300-30 MG tablet Commonly known as:  TYLENOL #3   ondansetron 4 MG disintegrating tablet Commonly known as: Zofran ODT     TAKE these medications   acetaminophen 325 MG tablet Commonly known as: Tylenol Take 2 tablets (650 mg total) by mouth every 8 (eight) hours as needed for mild pain.   amLODipine 5 MG tablet Commonly known as: NORVASC Take 5 mg by mouth daily.   aspirin EC 81 MG tablet Take 81 mg by mouth daily. Notes to patient: RESUME 48HRS AFTER SURGERY   cetirizine 10 MG tablet Commonly known as: ZYRTEC Take 10 mg by mouth daily as needed for allergies.   docusate sodium 100 MG capsule Commonly known as: Colace Take 1 capsule (100 mg total) by mouth 2 (two) times daily as needed for up to 10 days for mild constipation.   fluticasone 50 MCG/ACT nasal spray Commonly known as: FLONASE Place 1 spray into both nostrils daily as needed for allergies or rhinitis.   gabapentin 100 MG capsule Commonly known as: Neurontin Take 1 capsule (100 mg total) by mouth daily as needed.   hydrochlorothiazide 25 MG tablet Commonly known as: HYDRODIURIL Take 25 mg by mouth daily.   HYDROcodone-acetaminophen 5-325 MG tablet Commonly known as: Norco Take 1 tablet by mouth every 6 (six) hours as needed for up to 6 doses for moderate pain. What changed:   how much to take  additional instructions   levothyroxine 125 MCG tablet Commonly known as: SYNTHROID Take 125 mcg by mouth every Wednesday.   levothyroxine 112 MCG tablet Commonly known as: SYNTHROID Take 112 mcg by mouth See admin instructions.  Take by mouth daily every Thursday - Tuesday   meloxicam 15 MG tablet Commonly known as: MOBIC Take 15 mg by mouth daily.   metoprolol succinate 50 MG 24 hr tablet Commonly known as: TOPROL-XL Take 50 mg by mouth daily. Take with or immediately following a meal.   ondansetron 4 MG tablet Commonly known as: Zofran Take 1 tablet (4 mg total) by mouth every 8 (eight) hours as needed for nausea or vomiting.    Oxcarbazepine 300 MG tablet Commonly known as: TRILEPTAL Take 300 mg by mouth 2 (two) times daily.   potassium chloride 10 MEQ tablet Commonly known as: KLOR-CON Take 10 mEq by mouth daily.   simvastatin 20 MG tablet Commonly known as: ZOCOR Take 20 mg by mouth at bedtime.   tiZANidine 2 MG tablet Commonly known as: ZANAFLEX Take 2 mg by mouth 3 (three) times daily.       Follow-up Information    H[place], Corri Delapaz, DO Follow up.   Specialty: Surgery Why: Follow up on  07/30/20 at 8:30am  Contact information: [address] [place] [address] [phone]                Total time spent arranging discharge was >41min. Signed: Sung Amabile 07/22/2020, 12:08 PM

## 2020-08-01 ENCOUNTER — Other Ambulatory Visit: Payer: Self-pay | Admitting: Surgery

## 2020-08-01 DIAGNOSIS — R109 Unspecified abdominal pain: Secondary | ICD-10-CM

## 2020-08-08 ENCOUNTER — Ambulatory Visit: Admission: RE | Admit: 2020-08-08 | Payer: Medicare HMO | Source: Ambulatory Visit

## 2020-08-09 ENCOUNTER — Other Ambulatory Visit: Payer: Self-pay

## 2020-08-09 ENCOUNTER — Ambulatory Visit
Admission: RE | Admit: 2020-08-09 | Discharge: 2020-08-09 | Disposition: A | Discharge status: Home / Self Care | Payer: Medicare HMO | Source: Ambulatory Visit | Attending: Surgery | Admitting: Surgery

## 2020-08-09 DIAGNOSIS — R109 Unspecified abdominal pain: Secondary | ICD-10-CM

## 2021-09-05 IMAGING — CT CT CERVICAL SPINE W/O CM
3 of 4 series · 9 of 33 positions shown, 11 images · non-contrast
Comparison: Cervical MRI 11/29/2019

CLINICAL DATA: Bilateral arm pain and numbness for 6-8 months.

EXAM:
CT CERVICAL SPINE WITHOUT CONTRAST
TECHNIQUE: Multidetector CT imaging of the cervical spine was performed without
intravenous contrast. Multiplanar CT image reconstructions were also
generated.

[Series 6: sagittal bone · sagittal · 0.25mm/px · 5 of 47 slices shown, 6 images]
[im 16/47  bone]
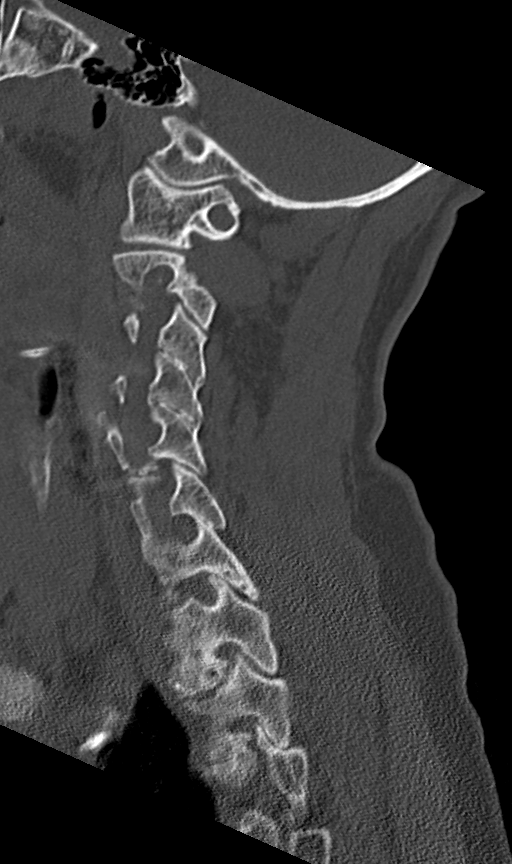
[im 20/47  bone]
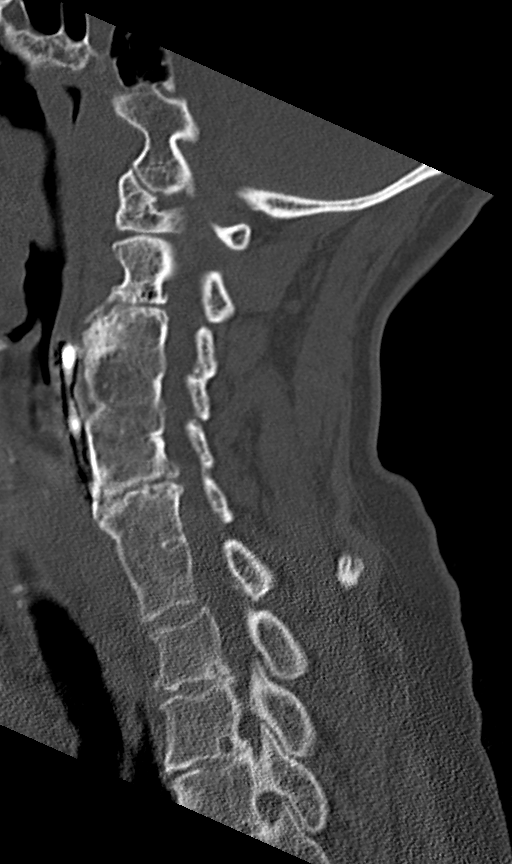
[im 24/47  soft-tissue]
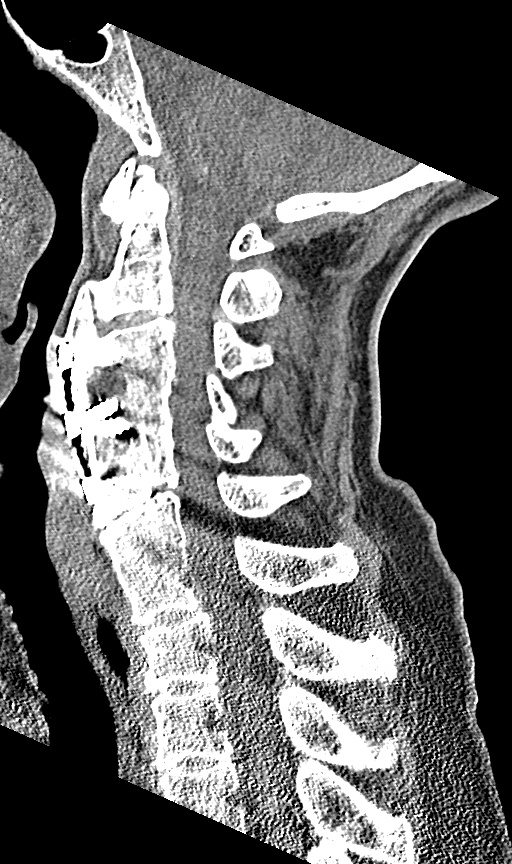
[im 24/47  bone]
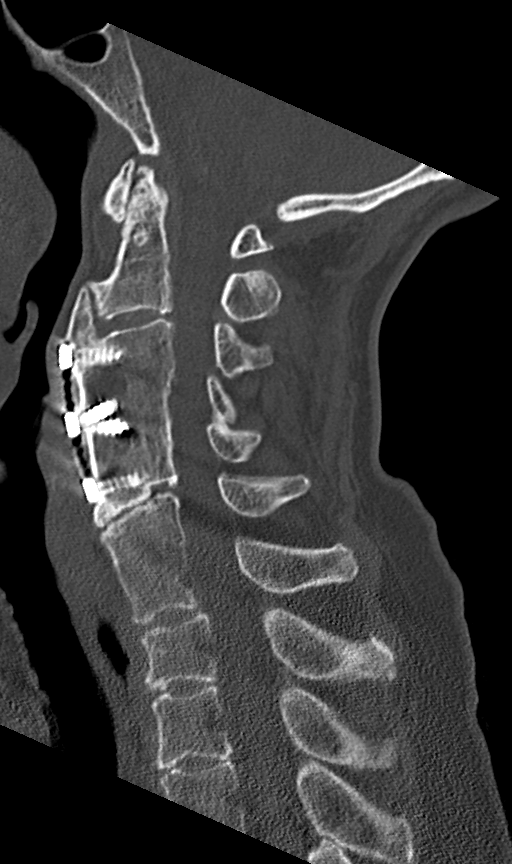
[im 27/47  bone]
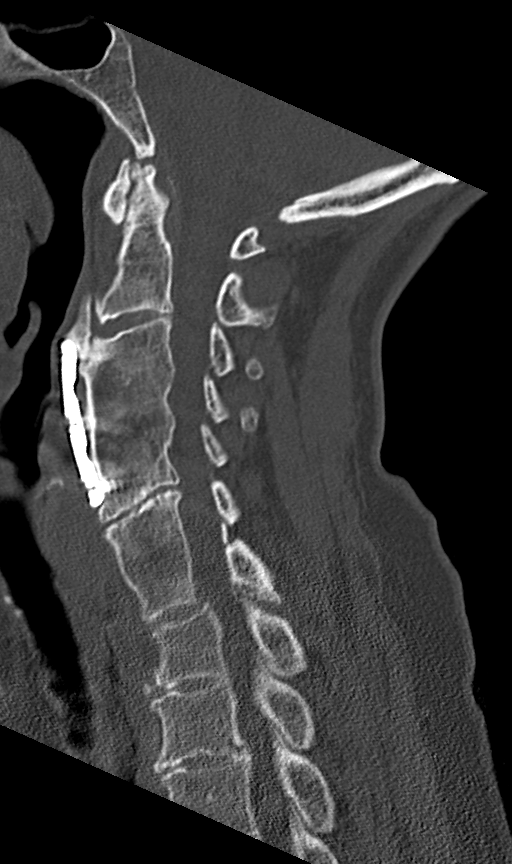
[im 31/47  bone]
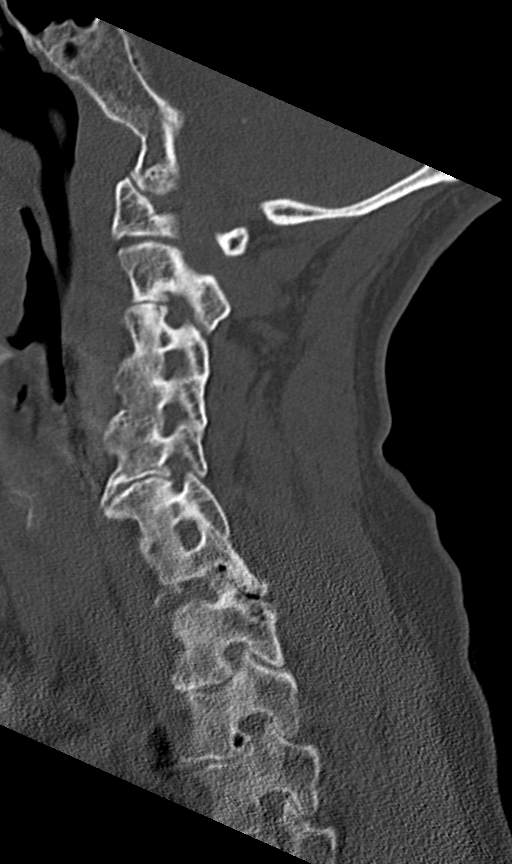

[Series 7: coronal bone · coronal · 0.18mm/px · 3 of 65 slices shown]
[im 15/65  bone]
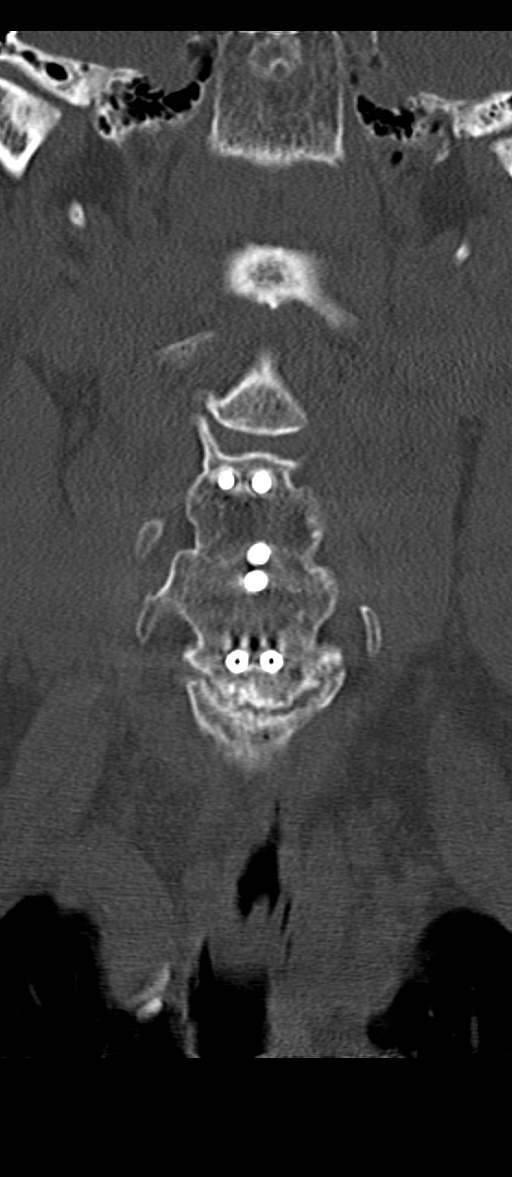
[im 27/65  bone]
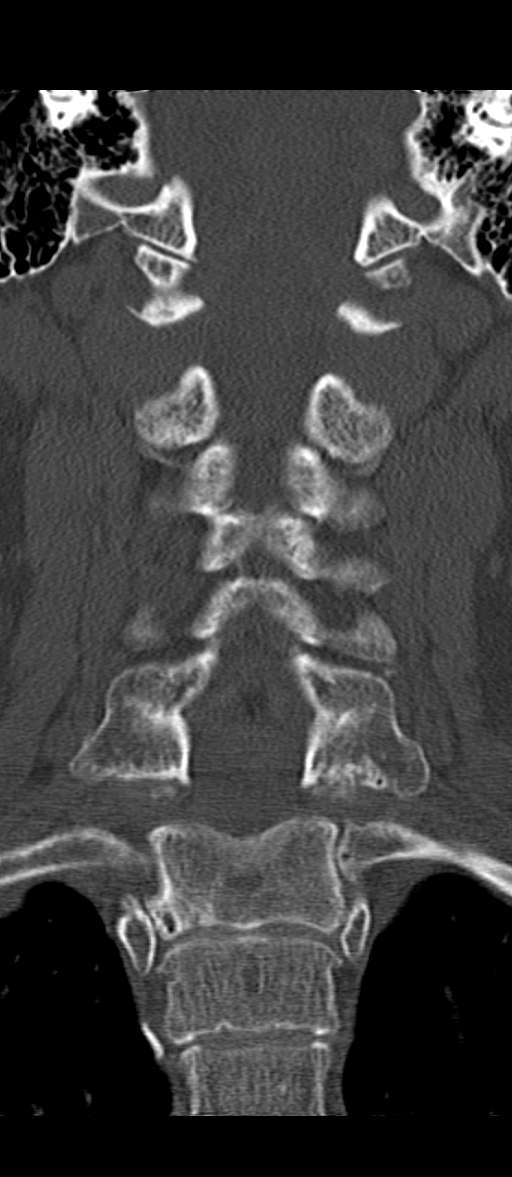
[im 39/65  bone]
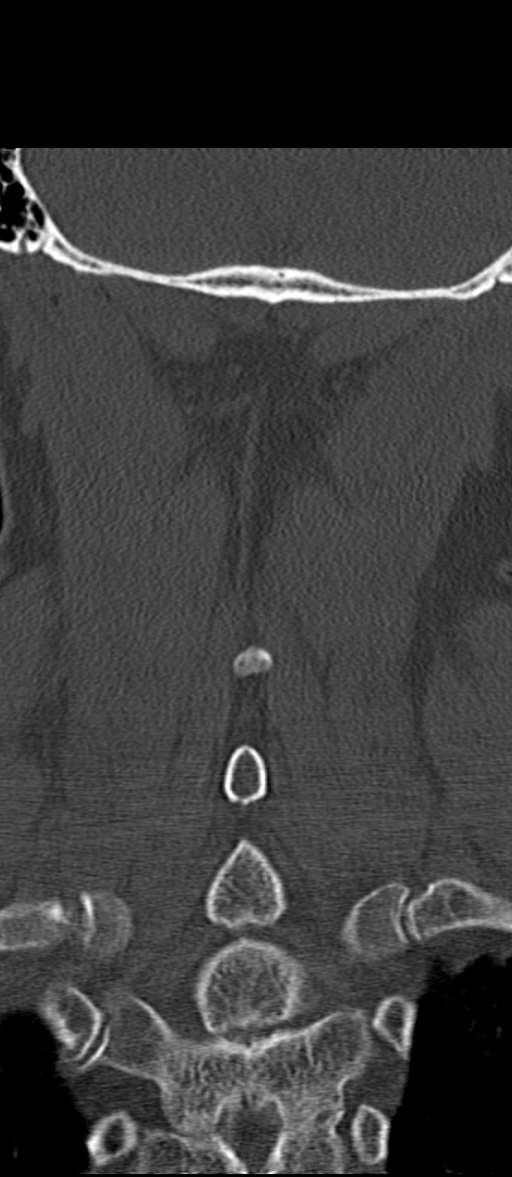

[Series 8: orthogonal bone · axial · 0.18mm/px · z∈[-116,-116]mm · 1 of 109 slices shown, 2 images]
[im 62/109  soft-tissue]
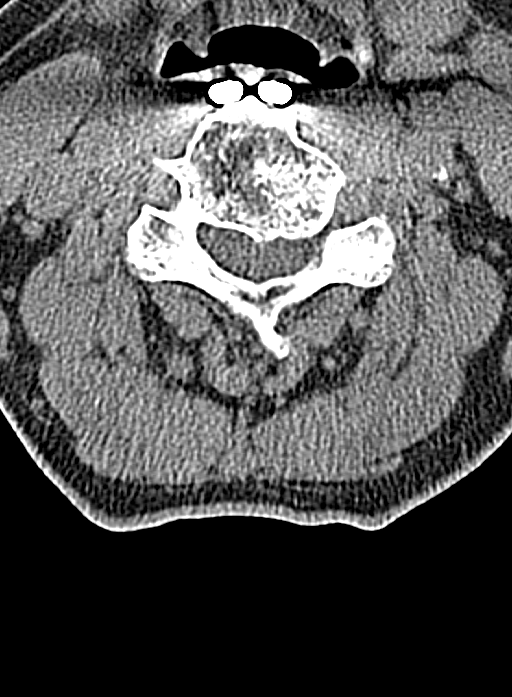
[im 62/109  bone]
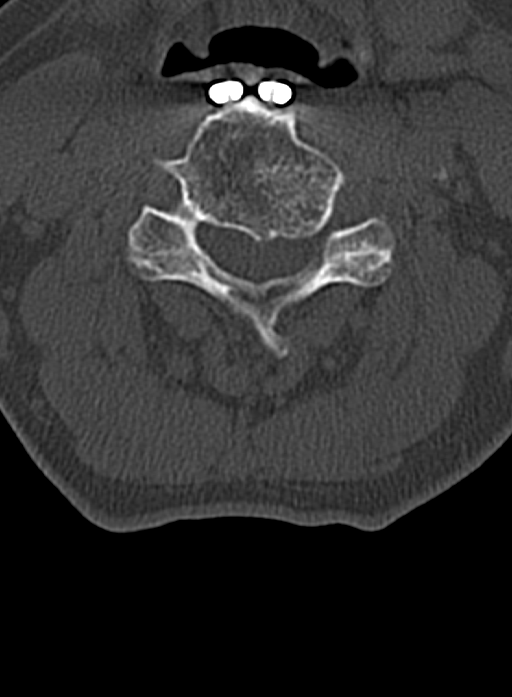

[9 of 33 positions shown; findings below may reference images not displayed]

FINDINGS: Alignment: Slight anterolisthesis at C7-T1

Skull base and vertebrae: C3-C5 ACDF with solid arthrodesis and
ventral plate. C6-7 ACDF without hardware. No evidence of fracture
or bone lesion.

Soft tissues and spinal canal: Negative

Disc levels:

C2-3: Bulky ventral spurring which is non bridging and related to
the ACDF plate. No bony impingement on the foramina.

C3-4: ACDF with solid arthrodesis and no bony foraminal impingement.
Mild spinal stenosis from residual posterior endplate ridging

C4-5: ACDF with solid arthrodesis. Mild residual foraminal and
spinal stenosis from ridging.

C5-6: Advanced disc narrowing with endplate ridging. There is
moderate bilateral foraminal narrowing and mild spinal stenosis by
MRI

C6-7: ACDF with solid arthrodesis and no bony impingement

C7-T1:Advanced facet osteoarthritis with spurring and subchondral
cyst formation greater on the left. There is left foraminal
impingement by MRI, moderate by CT.

Upper chest: Negative
IMPRESSION: 1. C3-C5 and C6-7 ACDF with solid arthrodesis.
2. Advanced adjacent disc degeneration at C5-6 with moderate
foraminal narrowing.
3. C7-T1 advanced facet osteoarthritis, worse on the left, with
anterolisthesis. Moderate left foraminal impingement.
